# Patient Record
Sex: Male | Born: 1960 | Race: White | Hispanic: No | Marital: Married | Smoking: Never smoker
Health system: Southern US, Community
[De-identification: ages and names within clinical notes are randomized; demographics above are authoritative.]

## PROBLEM LIST (undated history)

## (undated) DIAGNOSIS — R600 Localized edema: Secondary | ICD-10-CM

## (undated) DIAGNOSIS — R5383 Other fatigue: Secondary | ICD-10-CM

## (undated) DIAGNOSIS — B019 Varicella without complication: Secondary | ICD-10-CM

## (undated) DIAGNOSIS — M25569 Pain in unspecified knee: Secondary | ICD-10-CM

## (undated) DIAGNOSIS — N400 Enlarged prostate without lower urinary tract symptoms: Secondary | ICD-10-CM

## (undated) DIAGNOSIS — R12 Heartburn: Secondary | ICD-10-CM

## (undated) HISTORY — DX: Benign prostatic hyperplasia without lower urinary tract symptoms: N40.0

## (undated) HISTORY — DX: Heartburn: R12

## (undated) HISTORY — DX: Other fatigue: R53.83

## (undated) HISTORY — DX: Varicella without complication: B01.9

## (undated) HISTORY — DX: Localized edema: R60.0

## (undated) HISTORY — PX: URETHRA SURGERY: SHX824

## (undated) HISTORY — DX: Pain in unspecified knee: M25.569

---

## 2015-08-21 MED FILL — HYDROCODON-APAP 7.5-325: 7.5-325 | 2 days supply | Qty: 5 | Fill #0

## 2015-08-21 MED FILL — IBUPROFEN 600 MG TABLET: 600 | 5 days supply | Qty: 20 | Fill #0

## 2015-08-21 MED FILL — AMOXICILLIN 875 MG TABLET: 875 | 7 days supply | Qty: 14 | Fill #0

## 2015-08-30 MED FILL — ANDROGEL 1.62% GEL PUMP: 20.25 MG/AC | 60 days supply | Qty: 75 | Fill #1

## 2015-08-30 MED FILL — STRATTERA 60 MG CAPSULE: 60 | 90 days supply | Qty: 90 | Fill #2

## 2015-12-02 MED FILL — STRATTERA 60 MG CAPSULE: 60 | 90 days supply | Qty: 90 | Fill #3

## 2016-03-06 MED FILL — ATOMOXETINE HCL 60 MG CAP: 60 | 90 days supply | Qty: 90 | Fill #4

## 2016-06-05 MED FILL — ATOMOXETINE HCL 60 MG CAP: 60 | 30 days supply | Qty: 30 | Fill #5

## 2016-07-08 MED FILL — ATOMOXETINE HCL 60 MG CAP: 60 | 90 days supply | Qty: 90 | Fill #0

## 2016-09-22 DIAGNOSIS — Z0000 Encounter for general adult medical examination without abnormal findings: Secondary | ICD-10-CM | POA: Diagnosis not present

## 2016-09-22 DIAGNOSIS — R7301 Impaired fasting glucose: Secondary | ICD-10-CM | POA: Diagnosis not present

## 2016-09-22 DIAGNOSIS — E784 Other hyperlipidemia: Secondary | ICD-10-CM | POA: Diagnosis not present

## 2016-09-22 DIAGNOSIS — R41840 Attention and concentration deficit: Secondary | ICD-10-CM | POA: Diagnosis not present

## 2016-09-22 DIAGNOSIS — Z6841 Body Mass Index (BMI) 40.0 and over, adult: Secondary | ICD-10-CM | POA: Diagnosis not present

## 2016-09-22 DIAGNOSIS — Z1211 Encounter for screening for malignant neoplasm of colon: Secondary | ICD-10-CM | POA: Diagnosis not present

## 2016-09-22 DIAGNOSIS — Z125 Encounter for screening for malignant neoplasm of prostate: Secondary | ICD-10-CM | POA: Diagnosis not present

## 2016-09-22 DIAGNOSIS — E291 Testicular hypofunction: Secondary | ICD-10-CM | POA: Diagnosis not present

## 2016-09-22 LAB — HEPATIC FUNCTION PANEL
ALT: 19 (ref 10–40)
AST: 15 (ref 14–40)
Alkaline Phosphatase: 105 (ref 25–125)
Bilirubin, Total: 0.7

## 2016-09-22 LAB — BASIC METABOLIC PANEL
BUN: 22 — AB (ref 4–21)
Creatinine: 1.2 (ref 0.6–1.3)
GLUCOSE: 100
POTASSIUM: 4.6 (ref 3.4–5.3)
Sodium: 139 (ref 137–147)

## 2016-09-22 LAB — CBC AND DIFFERENTIAL
HCT: 45 (ref 41–53)
Hemoglobin: 15.4 (ref 13.5–17.5)
NEUTROS ABS: 6
Platelets: 244 (ref 150–399)
WBC: 8.6

## 2016-09-22 LAB — LIPID PANEL
CHOLESTEROL: 169 (ref 0–200)
HDL: 40 (ref 35–70)
LDL Cholesterol: 90
TRIGLYCERIDES: 197 — AB (ref 40–160)

## 2016-09-22 LAB — PSA: PSA: 0.97

## 2017-09-22 ENCOUNTER — Encounter: Payer: Self-pay | Admitting: Family Medicine

## 2017-09-23 MED FILL — DOXYCYCLINE HYCLATE 100 MG: 100 | 10 days supply | Qty: 20 | Fill #0

## 2017-09-23 MED FILL — CEPHALEXIN 500 MG CAPSULE: 500 | 10 days supply | Qty: 40 | Fill #0

## 2017-10-01 MED FILL — CEPHALEXIN 500 MG CAPSULE: 500 | 10 days supply | Qty: 40 | Fill #0

## 2017-10-01 MED FILL — DOXYCYCLINE HYCLATE 100 MG: 100 | 10 days supply | Qty: 20 | Fill #0

## 2017-10-05 ENCOUNTER — Ambulatory Visit: Payer: Self-pay | Admitting: Adult Health

## 2017-10-05 MED FILL — DICLOFENAC SODIUM 1% GEL: 1 | 10 days supply | Qty: 100 | Fill #0

## 2017-10-21 MED FILL — DICLOFENAC SODIUM 1% GEL: 1 | 10 days supply | Qty: 100 | Fill #1

## 2017-11-01 MED FILL — DICLOFENAC SODIUM 1% GEL: 1 | 10 days supply | Qty: 100 | Fill #2

## 2017-11-16 ENCOUNTER — Other Ambulatory Visit: Payer: Self-pay | Admitting: Family Medicine

## 2017-11-16 ENCOUNTER — Ambulatory Visit (INDEPENDENT_AMBULATORY_CARE_PROVIDER_SITE_OTHER): Payer: No Typology Code available for payment source | Admitting: Adult Health

## 2017-11-16 ENCOUNTER — Encounter: Payer: Self-pay | Admitting: Adult Health

## 2017-11-16 VITALS — BP 140/82 | Temp 97.5°F | Wt 376.0 lb

## 2017-11-16 DIAGNOSIS — Z7689 Persons encountering health services in other specified circumstances: Secondary | ICD-10-CM

## 2017-11-16 DIAGNOSIS — Z1211 Encounter for screening for malignant neoplasm of colon: Secondary | ICD-10-CM

## 2017-11-16 DIAGNOSIS — E668 Other obesity: Secondary | ICD-10-CM

## 2017-11-16 MED ORDER — DICLOFENAC SODIUM 1 % TD GEL: 4.0000 g | Freq: Four times a day (QID) | TRANSDERMAL | 1 refills | Status: DC

## 2017-11-16 MED FILL — DICLOFENAC SODIUM 1% GEL: 1 | 7 days supply | Qty: 100 | Fill #0

## 2017-11-16 NOTE — Telephone Encounter (Signed)
Sent to the pharmacy by e-scribe. 

## 2017-11-16 NOTE — Telephone Encounter (Signed)
Kandee KeenCory, you have not filled medication in the past.  Please advise.

## 2017-11-16 NOTE — Telephone Encounter (Signed)
Ok to refill + 1  

## 2017-11-16 NOTE — Patient Instructions (Signed)
It was great meeting you today   Please follow up with me for your physical   I will refer you to weight loss clinic   Please make sure to sign a release of information for the records from [facility]

## 2017-11-16 NOTE — Progress Notes (Signed)
Patient presents to clinic today to establish care. He is a pleasant 57 year old male who  has a past medical history of Chicken pox and Heartburn.   He is a previous patient of Dr. Modesto Charon at [facility].   His last physical was February 2018.   Acute Concerns: Establish Care   Chronic Issues: GERD - does not take any medication on a regular basis. Acidic/Spicy food is culprit.  Obesity - He would like to be referred to weight loss counseling. His wife has just started and he would like to see Dr. Dalbert Garnet. Reports that he and his wife have started eating better.   BPH with urinary obstruction - Has been using Azo for the last four months. Has had stent in the past? Get up in the middle of the night once to urinate. Has decreased stream. Denies any issues with incomplete bladder emptying.   Health Maintenance: Dental -- Routine Care  Vision --  Does not do routine care  Immunizations --  UTD  Colonoscopy -- Never had does not want one. Will do Cologuard    Past Medical History:  Diagnosis Date  . Chicken pox   . Heartburn     History reviewed. No pertinent surgical history.  Current Outpatient Medications on File Prior to Visit  Medication Sig Dispense Refill  . diclofenac sodium (VOLTAREN) 1 % GEL     . CRANBERRY PO Take by mouth.    . Cyanocobalamin (B-12 PO) Take by mouth.    . Ginkgo Biloba 40 MG TABS Take by mouth.    Marland Kitchen OVER THE COUNTER MEDICATION [facility]     No current facility-administered medications on file prior to visit.     Allergies  Allergen Reactions  . Sulfa Antibiotics     Family History  Problem Relation Age of Onset  . Stroke Father   . Cancer Brother   . Cancer Paternal Grandmother     Social History   Socioeconomic History  . Marital status: Married    Spouse name: Not on file  . Number of children: Not on file  . Years of education: Not on file  . Highest education level: Not on file  Occupational History  .  Not on file  Social Needs  . Financial resource strain: Not on file  . Food insecurity:    Worry: Not on file    Inability: Not on file  . Transportation needs:    Medical: Not on file    Non-medical: Not on file  Tobacco Use  . Smoking status: Never Smoker  . Smokeless tobacco: Never Used  Substance and Sexual Activity  . Alcohol use: Never    Frequency: Never  . Drug use: Not on file  . Sexual activity: Not on file  Lifestyle  . Physical activity:    Days per week: Not on file    Minutes per session: Not on file  . Stress: Not on file  Relationships  . Social connections:    Talks on phone: Not on file    Gets together: Not on file    Attends religious service: Not on file    Active member of club or organization: Not on file    Attends meetings of clubs or organizations: Not on file    Relationship status: Not on file  . Intimate partner violence:    Fear of current or ex partner: Not on file    Emotionally abused: Not on file  Physically abused: Not on file    Forced sexual activity: Not on file  Other Topics Concern  . Not on file  Social History Narrative  . Not on file    Review of Systems  Constitutional: Negative.   HENT: Negative.   Respiratory: Negative.   Cardiovascular: Negative.   Genitourinary: Negative.   Musculoskeletal: Positive for joint pain (left knee ).  Neurological: Negative.   Endo/Heme/Allergies: Negative.   Psychiatric/Behavioral: Negative.   All other systems reviewed and are negative.      BP 140/82   Temp (!) 97.5 F (36.4 C) (Oral)   Wt (!) 376 lb (170.6 kg)   Physical Exam  Constitutional: He is oriented to person, place, and time. He appears well-developed and well-nourished. No distress.  Obese    Cardiovascular: Normal rate, regular rhythm, normal heart sounds and intact distal pulses. Exam reveals no gallop and no friction rub.  No murmur heard. Pulmonary/Chest: Effort normal and breath sounds normal. No stridor.  No respiratory distress. He has no wheezes. He has no rales. He exhibits no tenderness.  Musculoskeletal: He exhibits edema (bilateral non pitting edema ). He exhibits no tenderness or deformity.  Neurological: He is alert and oriented to person, place, and time. He displays normal reflexes. No cranial nerve deficit or sensory deficit. He exhibits normal muscle tone. Coordination normal.  Skin: Skin is dry. Capillary refill takes less than 2 seconds. No rash noted. He is not diaphoretic. No erythema. No pallor.  Psychiatric: He has a normal mood and affect. His behavior is normal. Judgment and thought content normal.  Nursing note and vitals reviewed.  Assessment/Plan: 1. Encounter to establish care - Follow up for CPE  - Encouraged to start eating healthy and exercising    2. Other obesity - Amb Ref to Medical Weight Management  3. Colon cancer screening - Will order cologuard   Shirline Freesory Nyjae Hodge, NP

## 2017-12-06 MED FILL — DICLOFENAC SODIUM 1% GEL: 1 | 10 days supply | Qty: 100 | Fill #3

## 2017-12-21 ENCOUNTER — Encounter: Payer: Self-pay | Admitting: Adult Health

## 2017-12-21 ENCOUNTER — Ambulatory Visit (INDEPENDENT_AMBULATORY_CARE_PROVIDER_SITE_OTHER): Payer: No Typology Code available for payment source | Admitting: Adult Health

## 2017-12-21 ENCOUNTER — Encounter: Payer: Self-pay | Admitting: Family Medicine

## 2017-12-21 VITALS — BP 120/86 | Temp 97.8°F | Ht 73.0 in | Wt 370.0 lb

## 2017-12-21 DIAGNOSIS — M25561 Pain in right knee: Secondary | ICD-10-CM

## 2017-12-21 DIAGNOSIS — Z0000 Encounter for general adult medical examination without abnormal findings: Secondary | ICD-10-CM | POA: Diagnosis not present

## 2017-12-21 DIAGNOSIS — E668 Other obesity: Secondary | ICD-10-CM

## 2017-12-21 DIAGNOSIS — R351 Nocturia: Secondary | ICD-10-CM

## 2017-12-21 DIAGNOSIS — M25562 Pain in left knee: Secondary | ICD-10-CM | POA: Diagnosis not present

## 2017-12-21 DIAGNOSIS — R3912 Poor urinary stream: Secondary | ICD-10-CM

## 2017-12-21 DIAGNOSIS — Z1159 Encounter for screening for other viral diseases: Secondary | ICD-10-CM

## 2017-12-21 DIAGNOSIS — K219 Gastro-esophageal reflux disease without esophagitis: Secondary | ICD-10-CM | POA: Diagnosis not present

## 2017-12-21 DIAGNOSIS — G8929 Other chronic pain: Secondary | ICD-10-CM

## 2017-12-21 DIAGNOSIS — Z114 Encounter for screening for human immunodeficiency virus [HIV]: Secondary | ICD-10-CM

## 2017-12-21 DIAGNOSIS — N401 Enlarged prostate with lower urinary tract symptoms: Secondary | ICD-10-CM | POA: Diagnosis not present

## 2017-12-21 LAB — LIPID PANEL
CHOLESTEROL: 163 mg/dL (ref 0–200)
HDL: 37.5 mg/dL — ABNORMAL LOW (ref >39.00)
LDL Cholesterol: 93 mg/dL (ref 0–99)
NonHDL: 125.13
Total CHOL/HDL Ratio: 4
Triglycerides: 161 mg/dL — ABNORMAL HIGH (ref 0.0–149.0)
VLDL: 32.2 mg/dL (ref 0.0–40.0)

## 2017-12-21 LAB — CBC WITH DIFFERENTIAL/PLATELET
BASOS PCT: 0.5 % (ref 0.0–3.0)
Basophils Absolute: 0 10*3/uL (ref 0.0–0.1)
EOS PCT: 2.4 % (ref 0.0–5.0)
Eosinophils Absolute: 0.2 10*3/uL (ref 0.0–0.7)
HCT: 43.4 % (ref 39.0–52.0)
HEMOGLOBIN: 15.2 g/dL (ref 13.0–17.0)
LYMPHS PCT: 19.4 % (ref 12.0–46.0)
Lymphs Abs: 1.4 10*3/uL (ref 0.7–4.0)
MCHC: 35.1 g/dL (ref 30.0–36.0)
MCV: 84 fl (ref 78.0–100.0)
MONO ABS: 0.5 10*3/uL (ref 0.1–1.0)
MONOS PCT: 6.4 % (ref 3.0–12.0)
Neutro Abs: 5.1 10*3/uL (ref 1.4–7.7)
Neutrophils Relative %: 71.3 % (ref 43.0–77.0)
Platelets: 276 10*3/uL (ref 150.0–400.0)
RBC: 5.16 Mil/uL (ref 4.22–5.81)
RDW: 14.5 % (ref 11.5–15.5)
WBC: 7.1 10*3/uL (ref 4.0–10.5)

## 2017-12-21 LAB — HEPATIC FUNCTION PANEL
ALK PHOS: 102 U/L (ref 39–117)
ALT: 17 U/L (ref 0–53)
AST: 11 U/L (ref 0–37)
Albumin: 4 g/dL (ref 3.5–5.2)
BILIRUBIN DIRECT: 0.1 mg/dL (ref 0.0–0.3)
BILIRUBIN TOTAL: 0.7 mg/dL (ref 0.2–1.2)
Total Protein: 7.4 g/dL (ref 6.0–8.3)

## 2017-12-21 LAB — TSH: TSH: 2.65 u[IU]/mL (ref 0.35–4.50)

## 2017-12-21 LAB — BASIC METABOLIC PANEL
BUN: 17 mg/dL (ref 6–23)
CALCIUM: 8.9 mg/dL (ref 8.4–10.5)
CO2: 26 mEq/L (ref 19–32)
Chloride: 104 mEq/L (ref 96–112)
Creatinine, Ser: 1.01 mg/dL (ref 0.40–1.50)
GFR: 80.74 mL/min (ref >60.00)
GLUCOSE: 111 mg/dL — AB (ref 70–99)
Potassium: 4.4 mEq/L (ref 3.5–5.1)
SODIUM: 140 meq/L (ref 135–145)

## 2017-12-21 LAB — HEMOGLOBIN A1C: Hgb A1c MFr Bld: 5 % (ref 4.6–6.5)

## 2017-12-21 LAB — PSA: PSA: 0.9 ng/mL (ref 0.10–4.00)

## 2017-12-21 MED ORDER — DICLOFENAC SODIUM 1 % TD GEL: 4.0000 g | Freq: Four times a day (QID) | TRANSDERMAL | 1 refills | Status: DC

## 2017-12-21 MED FILL — DICLOFENAC SODIUM 1% GEL: 1 | 6 days supply | Qty: 100 | Fill #0

## 2017-12-21 NOTE — Progress Notes (Signed)
Subjective:    Patient ID: Justin Bowen, male    DOB: Jun 23, 1961, 57 y.o.   MRN: [id number]  HPI  Patient presents for yearly preventative medicine examination. He is a pleasant 57 year old male who  has a past medical history of BPH (benign prostatic hyperplasia), Chicken pox, and Heartburn.   Chronic knee pain - uses Voltaren gel - reports that this works well for him.   BPH - chronic issue. Reports " it is not that bad". He does not feel as though he needs any medication for this   All immunizations and health maintenance protocols were reviewed with the patient and needed orders were placed.  Appropriate screening laboratory values were ordered for the patient including screening of hyperlipidemia, renal function and hepatic function. If indicated by BPH, a PSA was ordered.  Medication reconciliation,  past medical history, social history, problem list and allergies were reviewed in detail with the patient  Goals were established with regard to weight loss, exercise, and  diet in compliance with medications. He has started making life style modification at home through diet. He is waiting on an appointment with the weight loss clinic.  Wt Readings from Last 3 Encounters:  12/21/17 (!) 370 lb (167.8 kg)  11/16/17 (!) 376 lb (170.6 kg)    End of life planning was discussed.  He has opted to due cologuard. He has it at home but has not returned it. He does routine dental screens but has not made an appointment with eye doctor yet    Review of Systems  Constitutional: Negative.   HENT: Negative.   Eyes: Negative.   Respiratory: Negative.   Cardiovascular: Negative.   Gastrointestinal: Negative.   Endocrine: Negative.   Genitourinary: Negative.   Musculoskeletal: Positive for arthralgias.  Skin: Negative.   Allergic/Immunologic: Negative.   Neurological: Negative.   Hematological: Negative.   Psychiatric/Behavioral: Negative.   All other systems reviewed and are  negative.  Past Medical History:  Diagnosis Date  . BPH (benign prostatic hyperplasia)   . Chicken pox   . Heartburn     Social History   Socioeconomic History  . Marital status: Married    Spouse name: Not on file  . Number of children: Not on file  . Years of education: Not on file  . Highest education level: Not on file  Occupational History  . Not on file  Social Needs  . Financial resource strain: Not on file  . Food insecurity:    Worry: Not on file    Inability: Not on file  . Transportation needs:    Medical: Not on file    Non-medical: Not on file  Tobacco Use  . Smoking status: Never Smoker  . Smokeless tobacco: Never Used  Substance and Sexual Activity  . Alcohol use: Never    Frequency: Never  . Drug use: Never  . Sexual activity: Not on file  Lifestyle  . Physical activity:    Days per week: Not on file    Minutes per session: Not on file  . Stress: Not on file  Relationships  . Social connections:    Talks on phone: Not on file    Gets together: Not on file    Attends religious service: Not on file    Active member of club or organization: Not on file    Attends meetings of clubs or organizations: Not on file    Relationship status: Not on file  . Intimate partner violence:  Fear of current or ex partner: Not on file    Emotionally abused: Not on file    Physically abused: Not on file    Forced sexual activity: Not on file  Other Topics Concern  . 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 No pertinent surgical history.  Family History  Problem Relation Age of Onset  . Stroke Father 66  . Cancer Brother        brain cancer   . Cancer Paternal Grandmother 45       pancreatic cancer     Allergies  Allergen Reactions  . Sulfa Antibiotics     Current Outpatient Medications on File Prior to Visit  Medication Sig Dispense Refill  . CRANBERRY PO Take by mouth.     . Cyanocobalamin (B-12 PO) Take by mouth.     No current facility-administered medications on file prior to visit.     BP 120/86   Temp 97.8 F (36.6 C) (Oral)   Ht  (1.854 m)   Wt (!) 370 lb (167.8 kg)   BMI 48.82 kg/m       Objective:   Physical Exam  Constitutional: He is oriented to person, place, and time. He appears well-developed and well-nourished. No distress.  Obese   HENT:  Head: Normocephalic and atraumatic.  Right Ear: External ear normal.  Left Ear: External ear normal.  Nose: Nose normal.  Mouth/Throat: Oropharynx is clear and moist. No oropharyngeal exudate.  Eyes: Pupils are equal, round, and reactive to light. Conjunctivae and EOM are normal. Right eye exhibits no discharge. Left eye exhibits no discharge. No scleral icterus.  Neck: Normal range of motion. Neck supple. No JVD present. No tracheal deviation present. No thyromegaly present.  Cardiovascular: Normal rate, regular rhythm, normal heart sounds and intact distal pulses. Exam reveals no gallop and no friction rub.  No murmur heard. Pulmonary/Chest: Effort normal and breath sounds normal. No stridor. No respiratory distress. He has no wheezes. He has no rales. He exhibits no tenderness.  Abdominal: Soft. Bowel sounds are normal. He exhibits no distension and no mass. There is no tenderness. There is no rebound and no guarding. No hernia.  Musculoskeletal: Normal range of motion. He exhibits no edema, tenderness or deformity.  Lymphadenopathy:    He has no cervical adenopathy.  Neurological: He is alert and oriented to person, place, and time. He displays normal reflexes. No cranial nerve deficit or sensory deficit. He exhibits normal muscle tone. Coordination normal.  Skin: Skin is warm and dry. Capillary refill takes less than 2 seconds. No rash noted. He is not diaphoretic. No erythema. No pallor.  Psychiatric: He has a normal mood and affect. His behavior is normal. Judgment and thought  content normal.  Nursing note and vitals reviewed.     Assessment & Plan:  1. Benign prostatic hyperplasia with nocturia  - PSA  2. Gastroesophageal reflux disease, esophagitis presence not specified - No issues currently.   3. Routine general medical examination at a health care facility  - diclofenac sodium (VOLTAREN) 1 % GEL; Apply 4 g topically 4 (four) times daily.  Dispense: 100 g; Refill: 1 - Basic metabolic panel - CBC with Differential/Platelet - Hemoglobin A1c - Hepatic function panel - Lipid panel - TSH - PSA - HIV antibody  4. Need for hepatitis C screening test  - Hep C Antibody  5.  Encounter for screening for HIV  - HIV antibody  6. Chronic pain of both knees  - diclofenac sodium (VOLTAREN) 1 % GEL; Apply 4 g topically 4 (four) times daily.  Dispense: 100 g; Refill: 1  7. Other obesity - Continue with life style modifications until seen by Weight loss clinic  - Basic metabolic panel - CBC with Differential/Platelet - Hemoglobin A1c - Hepatic function panel - Lipid panel - TSH  Shirline Frees, NP

## 2017-12-22 LAB — HEPATITIS C ANTIBODY
Hepatitis C Ab: NONREACTIVE
SIGNAL TO CUT-OFF: 0.01 (ref <1.00)

## 2017-12-22 LAB — HIV ANTIBODY (ROUTINE TESTING W REFLEX): HIV: NONREACTIVE

## 2017-12-23 MED FILL — IBUPROFEN 600 MG TABLET: 600 | 5 days supply | Qty: 20 | Fill #0

## 2017-12-23 MED FILL — HYDROCODON-APAP 7.5-325: 7.5-325 | 1 days supply | Qty: 2 | Fill #0

## 2017-12-23 MED FILL — AMOXICILLIN 875 MG TABS: 875 | 7 days supply | Qty: 14 | Fill #0

## 2018-01-21 MED FILL — DICLOFENAC SODIUM 1% GEL: 1 | 6 days supply | Qty: 100 | Fill #1

## 2018-02-21 ENCOUNTER — Telehealth: Payer: Self-pay | Admitting: Family Medicine

## 2018-02-21 NOTE — Telephone Encounter (Signed)
Copied from CRM [phone]. Topic: General - Other >> Feb 21, 2018 11:17 AM Marylen PontoMcneil, Ja-Kwan wrote: Reason for CRM: Pt state they have moved and he would need to transfer care to [facility]remer at [facility]. Cb# [phone]

## 2018-02-22 NOTE — Telephone Encounter (Signed)
Ok

## 2018-02-22 NOTE — Telephone Encounter (Signed)
Okay to schedule patient. 

## 2018-02-22 NOTE — Telephone Encounter (Signed)
Okay 

## 2018-02-25 ENCOUNTER — Ambulatory Visit (INDEPENDENT_AMBULATORY_CARE_PROVIDER_SITE_OTHER): Payer: No Typology Code available for payment source | Admitting: Family Medicine

## 2018-02-25 ENCOUNTER — Encounter: Payer: Self-pay | Admitting: Family Medicine

## 2018-02-25 ENCOUNTER — Ambulatory Visit (INDEPENDENT_AMBULATORY_CARE_PROVIDER_SITE_OTHER): Payer: No Typology Code available for payment source

## 2018-02-25 VITALS — BP 138/80 | HR 84 | Ht 73.0 in | Wt 378.0 lb

## 2018-02-25 DIAGNOSIS — N401 Enlarged prostate with lower urinary tract symptoms: Secondary | ICD-10-CM

## 2018-02-25 DIAGNOSIS — Z6841 Body Mass Index (BMI) 40.0 and over, adult: Secondary | ICD-10-CM | POA: Insufficient documentation

## 2018-02-25 DIAGNOSIS — R300 Dysuria: Secondary | ICD-10-CM

## 2018-02-25 DIAGNOSIS — G8929 Other chronic pain: Secondary | ICD-10-CM | POA: Diagnosis not present

## 2018-02-25 DIAGNOSIS — M25562 Pain in left knee: Secondary | ICD-10-CM

## 2018-02-25 DIAGNOSIS — Z1211 Encounter for screening for malignant neoplasm of colon: Secondary | ICD-10-CM | POA: Insufficient documentation

## 2018-02-25 DIAGNOSIS — R3912 Poor urinary stream: Secondary | ICD-10-CM

## 2018-02-25 DIAGNOSIS — M25561 Pain in right knee: Secondary | ICD-10-CM

## 2018-02-25 DIAGNOSIS — Z0001 Encounter for general adult medical examination with abnormal findings: Secondary | ICD-10-CM | POA: Insufficient documentation

## 2018-02-25 DIAGNOSIS — Z0000 Encounter for general adult medical examination without abnormal findings: Secondary | ICD-10-CM

## 2018-02-25 MED ORDER — DICLOFENAC SODIUM 1 % TD GEL: 2.0000 g | Freq: Four times a day (QID) | TRANSDERMAL | 1 refills | Status: DC

## 2018-02-25 MED ORDER — DOXAZOSIN MESYLATE 2 MG PO TABS: 2.0000 mg | ORAL_TABLET | Freq: Every day | ORAL | 1 refills | Status: DC

## 2018-02-25 MED FILL — DOXAZOSIN MESYLATE 2 MG TAB: 2 | 30 days supply | Qty: 30 | Fill #0

## 2018-02-25 MED FILL — DICLOFENAC SODIUM 1% GEL: 1 | 13 days supply | Qty: 100 | Fill #0

## 2018-02-25 NOTE — Patient Instructions (Addendum)
Knee Pain, Adult Knee pain in adults is common. It can be caused by many things, including:  Arthritis.  A fluid-filled sac (cyst) or growth in your knee.  An infection in your knee.  An injury that will not heal.  Damage, swelling, or irritation of the tissues that support your knee.  Knee pain is usually not a sign of a serious problem. The pain may go away on its own with time and rest. If it does not, a health care provider may order tests to find the cause of the pain. These may include:  Imaging tests, such as an X-ray, MRI, or ultrasound.  Joint aspiration. In this test, fluid is removed from the knee.  Arthroscopy. In this test, a lighted tube is inserted into knee and an image is projected onto a TV screen.  A biopsy. In this test, a sample of tissue is removed from the body and studied under a microscope.  Follow these instructions at home: Pay attention to any changes in your symptoms. Take these actions to relieve your pain. Activity  Rest your knee.  Do not do things that cause pain or make pain worse.  Avoid high-impact activities or exercises, such as running, jumping rope, or doing jumping jacks. General instructions  Take over-the-counter and prescription medicines only as told by your health care provider.  Raise (elevate) your knee above the level of your heart when you are sitting or lying down.  Sleep with a pillow under your knee.  If directed, apply ice to the knee: ? Put ice in a plastic bag. ? Place a towel between your skin and the bag. ? Leave the ice on for 20 minutes, 2-3 times a day.  Ask your health care provider if you should wear an elastic knee support.  Lose weight if you are overweight. Extra weight can put pressure on your knee.  Do not use any products that contain nicotine or tobacco, such as cigarettes and e-cigarettes. Smoking may slow the healing of any bone and joint problems that you may have. If you need help quitting, ask  your health care provider. Contact a health care provider if:  Your knee pain continues, changes, or gets worse.  You have a fever along with knee pain.  Your knee buckles or locks up.  Your knee swells, and the swelling becomes worse. Get help right away if:  Your knee feels warm to the touch.  You cannot move your knee.  You have severe pain in your knee.  You have chest pain.  You have trouble breathing. Summary  Knee pain in adults is common. It can be caused by many things, including, arthritis, infection, cysts, or injury.  Knee pain is usually not a sign of a serious problem, but if it does not go away, a health care provider may perform tests to know the cause of the pain.  Pay attention to any changes in your symptoms. Relieve your pain with rest, medicines, light activity, and use of ice.  Get help if your pain continues or becomes very severe, or if your knee buckles or locks up, or if you have chest pain or trouble breathing. This information is not intended to replace advice given to you by your health care provider. Make sure you discuss any questions you have with your health care provider. Document Released: 05/17/2007 Document Revised: 07/10/2016 Document Reviewed: 07/10/2016 Elsevier Interactive Patient Education  2018 [employer]  Benign Prostatic Hyperplasia Benign prostatic hyperplasia (BPH) is  an enlarged prostate gland that is caused by the normal aging process and not by cancer. The prostate is a walnut-sized gland that is involved in the production of semen. It is located in front of the rectum and below the bladder. The bladder stores urine and the urethra is the tube that carries the urine out of the body. The prostate may get bigger as a man gets older. An enlarged prostate can press on the urethra. This can make it harder to pass urine. The build-up of urine in the bladder can cause infection. Back pressure and infection may progress to bladder  damage and kidney (renal) failure. What are the causes? This condition is part of a normal aging process. However, not all men develop problems from this condition. If the prostate enlarges away from the urethra, urine flow will not be blocked. If it enlarges toward the urethra and compresses it, there will be problems passing urine. What increases the risk? This condition is more likely to develop in men over the age of 50 years. What are the signs or symptoms? Symptoms of this condition include:  Getting up often during the night to urinate.  Needing to urinate frequently during the day.  Difficulty starting urine flow.  Decrease in size and strength of your urine stream.  Leaking (dribbling) after urinating.  Inability to pass urine. This needs immediate treatment.  Inability to completely empty your bladder.  Pain when you pass urine. This is more common if there is also an infection.  Urinary tract infection (UTI).  How is this diagnosed? This condition is diagnosed based on your medical history, a physical exam, and your symptoms. Tests will also be done, such as:  A post-void bladder scan. This measures any amount of urine that may remain in your bladder after you finish urinating.  A digital rectal exam. In a rectal exam, your health care provider checks your prostate by putting a lubricated, gloved finger into your rectum to feel the back of your prostate gland. This exam detects the size of your gland and any abnormal lumps or growths.  An exam of your urine (urinalysis).  A prostate specific antigen (PSA) screening. This is a blood test used to screen for prostate cancer.  An ultrasound. This test uses sound waves to electronically produce a picture of your prostate gland.  Your health care provider may refer you to a specialist in kidney and prostate diseases (urologist). How is this treated? Once symptoms begin, your health care provider will monitor your  condition (active surveillance or watchful waiting). Treatment for this condition will depend on the severity of your condition. Treatment may include:  Observation and yearly exams. This may be the only treatment needed if your condition and symptoms are mild.  Medicines to relieve your symptoms, including: ? Medicines to shrink the prostate. ? Medicines to relax the muscle of the prostate.  Surgery in severe cases. Surgery may include: ? Prostatectomy. In this procedure, the prostate tissue is removed completely through an open incision or with a laparascope or robotics. ? Transurethral resection of the prostate (TURP). In this procedure, a tool is inserted through the opening at the tip of the penis (urethra). It is used to cut away tissue of the inner core of the prostate. The pieces are removed through the same opening of the penis. This removes the blockage. ? Transurethral incision (TUIP). In this procedure, small cuts are made in the prostate. This lessens the prostate's pressure on the urethra. ?  Transurethral microwave thermotherapy (TUMT). This procedure uses microwaves to create heat. The heat destroys and removes a small amount of prostate tissue. ? Transurethral needle ablation (TUNA). This procedure uses radio frequencies to destroy and remove a small amount of prostate tissue. ? Interstitial laser coagulation (ILC). This procedure uses a laser to destroy and remove a small amount of prostate tissue. ? Transurethral electrovaporization (TUVP). This procedure uses electrodes to destroy and remove a small amount of prostate tissue. ? Prostatic urethral lift. This procedure inserts an implant to push the lobes of the prostate away from the urethra.  Follow these instructions at home:  Take over-the-counter and prescription medicines only as told by your health care provider.  Monitor your symptoms for any changes. Contact your health care provider with any changes.  Avoid drinking  large amounts of liquid before going to bed or out in public.  Avoid or reduce how much caffeine or alcohol you drink.  Give yourself time when you urinate.  Keep all follow-up visits as told by your health care provider. This is important. Contact a health care provider if:  You have unexplained back pain.  Your symptoms do not get better with treatment.  You develop side effects from the medicine you are taking.  Your urine becomes very dark or has a bad smell.  Your lower abdomen becomes distended and you have trouble passing your urine. Get help right away if:  You have a fever or chills.  You suddenly cannot urinate.  You feel lightheaded, or very dizzy, or you faint.  There are large amounts of blood or clots in the urine.  Your urinary problems become hard to manage.  You develop moderate to severe low back or flank pain. The flank is the side of your body between the ribs and the hip. These symptoms may represent a serious problem that is an emergency. Do not wait to see if the symptoms will go away. Get medical help right away. Call your local emergency services (911 in the [place] Do not drive yourself to the hospital. Summary  Benign prostatic hyperplasia (BPH) is an enlarged prostate that is caused by the normal aging process and not by cancer.  An enlarged prostate can press on the urethra. This can make it hard to pass urine.  This condition is part of a normal aging process and is more likely to develop in men over the age of 50 years.  Get help right away if you suddenly cannot urinate. This information is not intended to replace advice given to you by your health care provider. Make sure you discuss any questions you have with your health care provider. Document Released: 07/20/2005 Document Revised: 08/24/2016 Document Reviewed: 08/24/2016 Elsevier Interactive Patient Education  2018 [employer]. Tamsulosin capsules What is this medicine? TAMSULOSIN  (tam SOO loe sin) is used to treat enlargement of the prostate gland in men, a condition called benign prostatic hyperplasia or BPH. It is not for use in women. It works by relaxing muscles in the prostate and bladder neck. This improves urine flow and reduces BPH symptoms. This medicine may be used for other purposes; ask your health care provider or pharmacist if you have questions. COMMON BRAND NAME(S): Flomax What should I tell my health care provider before I take this medicine? They need to know if you have any of the following conditions: -advanced kidney disease -advanced liver disease -low blood pressure -prostate cancer -an unusual or allergic reaction to tamsulosin, sulfa drugs, other  medicines, foods, dyes, or preservatives -pregnant or trying to get pregnant -breast-feeding How should I use this medicine? Take this medicine by mouth about 30 minutes after the same meal every day. Follow the directions on the prescription label. Swallow the capsules whole with a glass of water. Do not crush, chew, or open capsules. Do not take your medicine more often than directed. Do not stop taking your medicine unless your doctor tells you to. Talk to your pediatrician regarding the use of this medicine in children. Special care may be needed. Overdosage: If you think you have taken too much of this medicine contact a poison control center or emergency room at once. NOTE: This medicine is only for you. Do not share this medicine with others. What if I miss a dose? If you miss a dose, take it as soon as you can. If it is almost time for your next dose, take only that dose. Do not take double or extra doses. If you stop taking your medicine for several days or more, ask your doctor or health care professional what dose you should start back on. What may interact with this medicine? -cimetidine -fluoxetine -ketoconazole -medicines for erectile disfunction like sildenafil, tadalafil,  vardenafil -medicines for high blood pressure -other alpha-blockers like alfuzosin, doxazosin, phentolamine, phenoxybenzamine, prazosin, terazosin -warfarin This list may not describe all possible interactions. Give your health care provider a list of all the medicines, herbs, non-prescription drugs, or dietary supplements you use. Also tell them if you smoke, drink alcohol, or use illegal drugs. Some items may interact with your medicine. What should I watch for while using this medicine? Visit your doctor or health care professional for regular check ups. You will need lab work done before you start this medicine and regularly while you are taking it. Check your blood pressure as directed. Ask your health care professional what your blood pressure should be, and when you should contact him or her. This medicine may make you feel dizzy or lightheaded. This is more likely to happen after the first dose, after an increase in dose, or during hot weather or exercise. Drinking alcohol and taking some medicines can make this worse. Do not drive, use machinery, or do anything that needs mental alertness until you know how this medicine affects you. Do not sit or stand up quickly. If you begin to feel dizzy, sit down until you feel better. These effects can decrease once your body adjusts to the medicine. Contact your doctor or health care professional right away if you have an erection that lasts longer than 4 hours or if it becomes painful. This may be a sign of a serious problem and must be treated right away to prevent permanent damage. If you are thinking of having cataract surgery, tell your eye surgeon that you have taken this medicine. What side effects may I notice from receiving this medicine? Side effects that you should report to your doctor or health care professional as soon as possible: -allergic reactions like skin rash or itching, hives, swelling of the lips, mouth, tongue, or  throat -breathing problems -change in vision -feeling faint or lightheaded -irregular heartbeat -prolonged or painful erection -weakness Side effects that usually do not require medical attention (report to your doctor or health care professional if they continue or are bothersome): -back pain -change in sex drive or performance -constipation, nausea or vomiting -cough -drowsy -runny or stuffy nose -trouble sleeping This list may not describe all possible side effects. Call your  doctor for medical advice about side effects. You may report side effects to FDA at 1-800-FDA-1088. Where should I keep my medicine? Keep out of the reach of children. Store at room temperature between 15 and 30 degrees C (59 and 86 degrees F). Throw away any unused medicine after the expiration date. NOTE: This sheet is a summary. It may not cover all possible information. If you have questions about this medicine, talk to your doctor, pharmacist, or health care provider.  2018 Elsevier/Gold Standard (2012-07-20 14:11:34) Doxazosin tablets What is this medicine? DOXAZOSIN (dox AY zoe sin) is an antihypertensive. It works by relaxing the blood vessels. It is used to treat benign prostatic hyperplasia (BPH) in men and to treat high blood pressure in both men and women. This medicine may be used for other purposes; ask your health care provider or pharmacist if you have questions. COMMON BRAND NAME(S): Cardura What should I tell my health care provider before I take this medicine? They need to know if you have any of the following conditions: -kidney or liver disease -low blood pressure -an unusual or allergic reaction to doxazosin, other medicines, foods, dyes, or preservatives -pregnant or trying to get pregnant -breast-feeding How should I use this medicine? Take this medicine by mouth with a glass of water. Follow the directions on the prescription label. Take your doses at regular intervals. Do not take  your medicine more often than directed. Do not stop taking except on the advice of your doctor or health care professional. Talk to your pediatrician regarding the use of this medicine in children. Special care may be needed. Overdosage: If you think you have taken too much of this medicine contact a poison control center or emergency room at once. NOTE: This medicine is only for you. Do not share this medicine with others. What if I miss a dose? If you miss a dose, take it as soon as you can. If it is almost time for your next dose, take only that dose. Do not take double or extra doses. What may interact with this medicine? -cimetidine -medicines for colds or hay fever -medicines for overactive bladder -sildenafil -tadalafil -vardenafil This list may not describe all possible interactions. Give your health care provider a list of all the medicines, herbs, non-prescription drugs, or dietary supplements you use. Also tell them if you smoke, drink alcohol, or use illegal drugs. Some items may interact with your medicine. What should I watch for while using this medicine? Visit your doctor or health care professional for regular checks on your progress. Check your blood pressure regularly. Ask your doctor or health care professional what your blood pressure should be and when you should contact him or her. Drowsiness and dizziness are more likely to occur after the first dose, after an increase in dose, or during hot weather or exercise. These effects can decrease once your body adjusts to this medicine. Do not drive, use machinery, or do anything that needs mental alertness until you know how this drug affects you. Do not stand or sit up quickly, especially if you are an older patient. This reduces the risk of dizzy or fainting spells. Alcohol can make you more drowsy and dizzy. Avoid alcoholic drinks. Do not treat yourself for coughs, colds, or pain while you are taking this medicine without asking  your doctor or health care professional for advice. Some ingredients may increase your blood pressure. Your mouth may get dry. Chewing sugarless gum or sucking hard candy, and drinking  plenty of water may help. Contact your doctor if the problem does not go away or is severe. If you are going to have eye surgery for cataracts, tell your doctor or health care professional you are taking this medicine. A condition called Intraoperative Floppy Iris Syndrome (IFIS) can happen if you have taken this medicine. For males, contact your doctor or health care professional right away if you have an erection that lasts longer than 4 hours or if it becomes painful. This may be a sign of a serious problem and must be treated right away to prevent permanent damage. What side effects may I notice from receiving this medicine? Side effects that you should report to your doctor or health care professional as soon as possible: -allergic reactions like skin rash, itching or hives, swelling of the face, lips, or tongue -breathing problems -changes in vision -chest pain -fast or irregular heartbeat -feeling faint or lightheaded, falls -males: prolonged or painful erection -numbness in hands or feet -swelling of the legs or ankles -unusually weak or tired Side effects that usually do not require medical attention (report to your doctor or health care professional if they continue or are bothersome): -headache -nausea This list may not describe all possible side effects. Call your doctor for medical advice about side effects. You may report side effects to FDA at 1-800-FDA-1088. Where should I keep my medicine? Keep out of the reach of children. Store at room temperature below 30 degrees C (86 degrees F). Throw away any unused medicine after the expiration date. NOTE: This sheet is a summary. It may not cover all possible information. If you have questions about this medicine, talk to your doctor, pharmacist, or  health care provider.  2018 Elsevier/Gold Standard (2012-07-20 14:02:30)

## 2018-02-25 NOTE — Progress Notes (Signed)
Subjective:  Patient ID: Justin Bowen, male    DOB: 01-09-1961  Age: 57 y.o. MRN: [id number]  CC: Establish Care   HPI [name] presents for establishment of care and follow-up of some of his medical issues.  He has a long-standing history of chronic bilateral knee pain.  Pain is responded to Voltaren gel and he requests a refill.  He denies any recent injuries to his knees.  Other than the pain they are not giving him problems.  Long-standing history of morbid obesity obesity.  He has an upcoming clinic visit with the weight loss center.  He tells of an ongoing history of urinary hesitancy, weak urinary stream, sensation of incomplete emptying with nocturia x1.  He tells me that he had some type of urological procedure 20 years ago.  It had helped his urine flow at that time.  Recent PSA drawn back in April was 0.9.  He has occasional dysuria without discharge.  He has been taking AZO standard as needed.  He has swelling in his lower extremities that happens to be slightly worse on the left.  Swelling decreases at night but then reaccumulate says he is up on his feet during the day.  He has no issues with his heart liver or kidneys.  Recent blood work from the chart was reviewed and found to be essentially normal.  He has no history of hypertension.  Patient lives with his wife who is an oncology nurse at [employer].  They have 2 grown daughters and 2  grandchildren.  He is currently supervising the renovation of their home.  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  He took a leave of absence from the [employer] to work on his home.  He is not interested in having a colonoscopy but did agree to accept information on Cologuard.  He did ask about being screened for HIV.  Explained that the current recommendations are to screen every 1.  Outpatient Medications Prior to Visit  Medication Sig Dispense Refill  . CRANBERRY PO Take by mouth.    . Cyanocobalamin (B-12 PO) Take by mouth.     . diclofenac sodium (VOLTAREN) 1 % GEL Apply 4 g topically 4 (four) times daily. 100 g 1   No facility-administered medications prior to visit.     ROS Review of Systems  Constitutional: Negative for chills, fatigue, fever and unexpected weight change.  Respiratory: Negative for chest tightness, shortness of breath and wheezing.   Cardiovascular: Negative for chest pain and palpitations.  Gastrointestinal: Negative.   Endocrine: Negative for cold intolerance and heat intolerance.  Genitourinary: Positive for difficulty urinating and dysuria. Negative for decreased urine volume and discharge.  Musculoskeletal: Positive for arthralgias.  Skin: Negative for pallor and rash.  Allergic/Immunologic: Negative for immunocompromised state.  Neurological: Negative for weakness and headaches.  Hematological: Does not bruise/bleed easily.  Psychiatric/Behavioral: Negative.     Objective:  BP 138/80   Pulse 84   Ht 6' 1"  (1.854 m)   Wt (!) 378 lb (171.5 kg)   SpO2 97%   BMI 49.87 kg/m   BP Readings from Last 3 Encounters:  02/25/18 138/80  12/21/17 120/86  11/16/17 140/82    Wt Readings from Last 3 Encounters:  02/25/18 (!) 378 lb (171.5 kg)  12/21/17 (!) 370 lb (167.8 kg)  11/16/17 (!) 376 lb (170.6 kg)    Physical Exam  Constitutional: He is oriented to person, place, and time. He appears well-developed  and well-nourished. No distress.  HENT:  Head: Normocephalic and atraumatic.  Right Ear: External ear normal.  Left Ear: External ear normal.  Mouth/Throat: No oropharyngeal exudate.  Eyes: Right eye exhibits no discharge. Left eye exhibits no discharge. No scleral icterus.  Neck: No JVD present. No tracheal deviation present.  Pulmonary/Chest: Effort normal.  Genitourinary: Rectal exam shows no external hemorrhoid, no internal hemorrhoid, no fissure, no mass, no tenderness, anal tone normal and guaiac negative stool. Prostate is not enlarged (prostate not reached with  exam).  Neurological: He is alert and oriented to person, place, and time.  Skin: He is not diaphoretic.  Psychiatric: He has a normal mood and affect. His behavior is normal.    Lab Results  Component Value Date   WBC 7.1 12/21/2017   HGB 15.2 12/21/2017   HCT 43.4 12/21/2017   PLT 276.0 12/21/2017   GLUCOSE 111 (H) 12/21/2017   CHOL 163 12/21/2017   TRIG 161.0 (H) 12/21/2017   HDL 37.50 (L) 12/21/2017   LDLCALC 93 12/21/2017   ALT 17 12/21/2017   AST 11 12/21/2017   NA 140 12/21/2017   K 4.4 12/21/2017   CL 104 12/21/2017   CREATININE 1.01 12/21/2017   BUN 17 12/21/2017   CO2 26 12/21/2017   TSH 2.65 12/21/2017   PSA 0.90 12/21/2017   HGBA1C 5.0 12/21/2017    Patient was never admitted.  Assessment & Plan:   Tunis was seen today for establish care.  Diagnoses and all orders for this visit:  Chronic pain of both knees -     DG Knee Complete 4 Views Left; Future -     DG Knee Complete 4 Views Right; Future -     diclofenac sodium (VOLTAREN) 1 % GEL; Apply 2 g topically 4 (four) times daily. -     DG Knee Complete 4 Views Right -     DG Knee Complete 4 Views Left  Dysuria -     Urinalysis, Routine w reflex microscopic -     Urine cytology ancillary only  Morbid obesity (HCC)  Benign prostatic hyperplasia with weak urinary stream -     Urinalysis, Routine w reflex microscopic -     Urine cytology ancillary only -     doxazosin (CARDURA) 2 MG tablet; Take 1 tablet (2 mg total) by mouth daily. At night  Routine general medical examination at a health care facility  Screen for colon cancer -     Cologuard   I have changed Dashaun Ditter's diclofenac sodium. I am also having him start on doxazosin. Additionally, I am having him maintain his Cyanocobalamin (B-12 PO) and CRANBERRY PO.  Meds ordered this encounter  Medications  . doxazosin (CARDURA) 2 MG tablet    Sig: Take 1 tablet (2 mg total) by mouth daily. At night    Dispense:  30 tablet    Refill:  1   . diclofenac sodium (VOLTAREN) 1 % GEL    Sig: Apply 2 g topically 4 (four) times daily.    Dispense:  100 g    Refill:  1   Patient was given a Cologuard kit and was explained to him that he would have to clear that with his insurance first.  Obtaining knee x-rays today.  Believe that he is work with the weight loss clinic and weight loss is strongly warranted.  He cannot take Flomax because of hives with sulfur.  He will try Cardura at night.  Hypotension warnings were given.  Explained that he will be screened for chlamydia even though given his social history it is highly unlikely to be a factor is occasional dysuria.  Regarding his lower extremity swelling, I do not believe there is a serious etiology at this time.  He will try compression stockings and let me know if they are not helpful.  Follow-up: Return in about 1 month (around 03/28/2018).  Libby Maw, MD

## 2018-03-21 MED FILL — DICLOFENAC SODIUM 1% GEL: 1 | 13 days supply | Qty: 100 | Fill #1

## 2018-03-29 ENCOUNTER — Other Ambulatory Visit (HOSPITAL_COMMUNITY)
Admission: RE | Admit: 2018-03-29 | Discharge: 2018-03-29 | Disposition: A | Discharge status: Home / Self Care | Payer: No Typology Code available for payment source | Source: Ambulatory Visit | Attending: Family Medicine | Admitting: Family Medicine

## 2018-03-29 ENCOUNTER — Encounter: Payer: Self-pay | Admitting: Family Medicine

## 2018-03-29 ENCOUNTER — Ambulatory Visit (INDEPENDENT_AMBULATORY_CARE_PROVIDER_SITE_OTHER): Payer: No Typology Code available for payment source | Admitting: Family Medicine

## 2018-03-29 VITALS — BP 140/80 | HR 81 | Ht 73.0 in | Wt 375.2 lb

## 2018-03-29 DIAGNOSIS — N401 Enlarged prostate with lower urinary tract symptoms: Secondary | ICD-10-CM | POA: Insufficient documentation

## 2018-03-29 DIAGNOSIS — R319 Hematuria, unspecified: Secondary | ICD-10-CM

## 2018-03-29 DIAGNOSIS — N390 Urinary tract infection, site not specified: Secondary | ICD-10-CM

## 2018-03-29 DIAGNOSIS — R609 Edema, unspecified: Secondary | ICD-10-CM | POA: Diagnosis not present

## 2018-03-29 DIAGNOSIS — R3912 Poor urinary stream: Secondary | ICD-10-CM | POA: Diagnosis not present

## 2018-03-29 DIAGNOSIS — M25561 Pain in right knee: Secondary | ICD-10-CM

## 2018-03-29 DIAGNOSIS — G8929 Other chronic pain: Secondary | ICD-10-CM

## 2018-03-29 DIAGNOSIS — R300 Dysuria: Secondary | ICD-10-CM

## 2018-03-29 DIAGNOSIS — Z0000 Encounter for general adult medical examination without abnormal findings: Secondary | ICD-10-CM | POA: Diagnosis not present

## 2018-03-29 DIAGNOSIS — M25562 Pain in left knee: Secondary | ICD-10-CM

## 2018-03-29 DIAGNOSIS — R030 Elevated blood-pressure reading, without diagnosis of hypertension: Secondary | ICD-10-CM | POA: Insufficient documentation

## 2018-03-29 LAB — URINALYSIS, ROUTINE W REFLEX MICROSCOPIC
Bilirubin Urine: NEGATIVE
Ketones, ur: NEGATIVE
Leukocytes, UA: NEGATIVE
Nitrite: POSITIVE — AB
SPECIFIC GRAVITY, URINE: 1.02 (ref 1.000–1.030)
Urine Glucose: NEGATIVE
Urobilinogen, UA: 1 (ref 0.0–1.0)
pH: 6 (ref 5.0–8.0)

## 2018-03-29 MED ORDER — DOXAZOSIN MESYLATE 4 MG PO TABS: 4.0000 mg | ORAL_TABLET | Freq: Every day | ORAL | 0 refills | Status: DC

## 2018-03-29 MED ORDER — DICLOFENAC SODIUM 1 % TD GEL: 2.0000 g | Freq: Four times a day (QID) | TRANSDERMAL | 2 refills | Status: DC

## 2018-03-29 MED ORDER — CHLORTHALIDONE 25 MG PO TABS: 25.0000 mg | ORAL_TABLET | Freq: Every day | ORAL | 0 refills | Status: DC

## 2018-03-29 MED FILL — DOXAZOSIN MESYLATE 4 MG TAB: 4 | 90 days supply | Qty: 90 | Fill #0

## 2018-03-29 MED FILL — CHLORTHALIDONE 25 MG TABS: 25 | 90 days supply | Qty: 90 | Fill #0

## 2018-03-29 MED FILL — DICLOFENAC SODIUM 1% GEL: 1 | 25 days supply | Qty: 200 | Fill #0

## 2018-03-29 NOTE — Progress Notes (Addendum)
Subjective:  Patient ID: Justin Bowen, male    DOB: October 31, 1960  Age: 57 y.o. MRN: [id number]  CC: Follow-up   HPI Justin Bowen presents for follow-up of his BPH with treatment with low-dose Cardura.  Patient did notice some improvement with his urine flow but it is still not that much better.  He will go some nights with nocturia but not all nights.  Urine flow and force of stream have improved slightly.  Lower extremity edema continues to be an issue.  Compression stockings have been ordered.  He was unable to give [place] a urine sample last visit but will do so today.  He tells that the Voltaren gel helps his knees but he tends to run out of his prescription after 3 weeks.  Outpatient Medications Prior to Visit  Medication Sig Dispense Refill  . CRANBERRY PO Take by mouth.    . Cyanocobalamin (B-12 PO) Take by mouth.    . diclofenac sodium (VOLTAREN) 1 % GEL Apply 2 g topically 4 (four) times daily. 100 g 1  . doxazosin (CARDURA) 2 MG tablet Take 1 tablet (2 mg total) by mouth daily. At night 30 tablet 1   No facility-administered medications prior to visit.     ROS Review of Systems  Constitutional: Negative.   HENT: Negative.   Respiratory: Negative.   Cardiovascular: Negative.   Gastrointestinal: Negative.   Endocrine: Negative for polyphagia and polyuria.  Genitourinary: Positive for difficulty urinating and dysuria. Negative for decreased urine volume, discharge and penile pain.  Musculoskeletal: Positive for arthralgias and gait problem.  Allergic/Immunologic: Negative for immunocompromised state.  Neurological: Negative for weakness and numbness.  Hematological: Does not bruise/bleed easily.  Psychiatric/Behavioral: Negative.     Objective:  BP 140/80   Pulse 81   Ht 6\' 1"  (1.854 m)   Wt (!) 375 lb 4 oz (170.2 kg)   SpO2 95%   BMI 49.51 kg/m   BP Readings from Last 3 Encounters:  03/29/18 140/80  02/25/18 138/80  12/21/17 120/86    Wt Readings from Last 3  Encounters:  03/29/18 (!) 375 lb 4 oz (170.2 kg)  02/25/18 (!) 378 lb (171.5 kg)  12/21/17 (!) 370 lb (167.8 kg)    Physical Exam  Constitutional: He is oriented to person, place, and time. He appears well-developed and well-nourished. No distress.  HENT:  Head: Normocephalic and atraumatic.  Right Ear: External ear normal.  Left Ear: External ear normal.  Eyes: Right eye exhibits no discharge. Left eye exhibits no discharge. No scleral icterus.  Neck: No JVD present. No tracheal deviation present.  Pulmonary/Chest: Effort normal.  Musculoskeletal: Normal range of motion.  Neurological: He is alert and oriented to person, place, and time.  Skin: Skin is warm and dry. He is not diaphoretic.  Psychiatric: He has a normal mood and affect. His behavior is normal.    Lab Results  Component Value Date   WBC 7.1 12/21/2017   HGB 15.2 12/21/2017   HCT 43.4 12/21/2017   PLT 276.0 12/21/2017   GLUCOSE 111 (H) 12/21/2017   CHOL 163 12/21/2017   TRIG 161.0 (H) 12/21/2017   HDL 37.50 (L) 12/21/2017   LDLCALC 93 12/21/2017   ALT 17 12/21/2017   AST 11 12/21/2017   NA 140 12/21/2017   K 4.4 12/21/2017   CL 104 12/21/2017   CREATININE 1.01 12/21/2017   BUN 17 12/21/2017   CO2 26 12/21/2017   TSH 2.65 12/21/2017   PSA 0.90 12/21/2017  HGBA1C 5.0 12/21/2017    Patient was never admitted.  Assessment & Plan:   Justin BurdockRichard was seen today for follow-up.  Diagnoses and all orders for this visit:  Edema, unspecified type -     chlorthalidone (HYGROTON) 25 MG tablet; Take 1 tablet (25 mg total) by mouth daily. In the morning.  Dysuria -     Urine cytology ancillary only -     Urinalysis, Routine w reflex microscopic  Benign prostatic hyperplasia with weak urinary stream -     Urine cytology ancillary only -     Urinalysis, Routine w reflex microscopic -     doxazosin (CARDURA) 4 MG tablet; Take 1 tablet (4 mg total) by mouth daily. nightly  Routine general medical examination at  a health care facility -     Urine cytology ancillary only -     Urinalysis, Routine w reflex microscopic  Chronic pain of both knees -     diclofenac sodium (VOLTAREN) 1 % GEL; Apply 2 g topically 4 (four) times daily.  Elevated BP without diagnosis of hypertension -     chlorthalidone (HYGROTON) 25 MG tablet; Take 1 tablet (25 mg total) by mouth daily. In the morning. -     doxazosin (CARDURA) 4 MG tablet; Take 1 tablet (4 mg total) by mouth daily. nightly  Urinary tract infection with hematuria, site unspecified -     ciprofloxacin (CIPRO) 500 MG tablet; Take 1 tablet (500 mg total) by mouth 2 (two) times daily for 10 days.   I have discontinued Justin Bowen's doxazosin. I am also having Justin Bowen start on chlorthalidone, doxazosin, and ciprofloxacin. Additionally, I am having Justin Bowen maintain his Cyanocobalamin (B-12 PO), CRANBERRY PO, and diclofenac sodium.  Meds ordered this encounter  Medications  . chlorthalidone (HYGROTON) 25 MG tablet    Sig: Take 1 tablet (25 mg total) by mouth daily. In the morning.    Dispense:  90 tablet    Refill:  0  . doxazosin (CARDURA) 4 MG tablet    Sig: Take 1 tablet (4 mg total) by mouth daily. nightly    Dispense:  90 tablet    Refill:  0  . diclofenac sodium (VOLTAREN) 1 % GEL    Sig: Apply 2 g topically 4 (four) times daily.    Dispense:  200 g    Refill:  2    Patient is running out of gel every 3 weeks.  . ciprofloxacin (CIPRO) 500 MG tablet    Sig: Take 1 tablet (500 mg total) by mouth 2 (two) times daily for 10 days.    Dispense:  20 tablet    Refill:  0   We will increase dose of Cardura and add chlorthalidone.  Discussed a drop in his blood pressure and lightheadedness is being side effects of these therapies.  [place] and she will start monitoring his blood pressure on a daily basis.  Advised Justin Bowen to return in a month or sooner if his pressures start running under 100/60.  Follow-up: Return in about 1 month (around  04/29/2018).  Mliss SaxWilliam Alfred Kremer, MD

## 2018-03-30 ENCOUNTER — Encounter: Payer: Self-pay | Admitting: Family Medicine

## 2018-03-30 LAB — URINE CYTOLOGY ANCILLARY ONLY: CHLAMYDIA, DNA PROBE: NEGATIVE

## 2018-03-30 MED ORDER — CIPROFLOXACIN HCL 500 MG PO TABS: 500.0000 mg | ORAL_TABLET | Freq: Two times a day (BID) | ORAL | 0 refills | Status: AC

## 2018-03-30 NOTE — Addendum Note (Signed)
Addended by: Andrez GrimeKREMER, Joesph Marcy A on: 03/30/2018 11:27 AM   Modules accepted: Orders

## 2018-04-19 ENCOUNTER — Encounter (INDEPENDENT_AMBULATORY_CARE_PROVIDER_SITE_OTHER): Payer: Self-pay

## 2018-04-20 ENCOUNTER — Encounter (INDEPENDENT_AMBULATORY_CARE_PROVIDER_SITE_OTHER): Payer: Self-pay

## 2018-04-21 ENCOUNTER — Ambulatory Visit (INDEPENDENT_AMBULATORY_CARE_PROVIDER_SITE_OTHER): Payer: No Typology Code available for payment source | Admitting: Family Medicine

## 2018-04-21 ENCOUNTER — Encounter (INDEPENDENT_AMBULATORY_CARE_PROVIDER_SITE_OTHER): Payer: Self-pay | Admitting: Family Medicine

## 2018-04-21 VITALS — BP 115/73 | HR 73 | Ht 72.0 in | Wt 363.0 lb

## 2018-04-21 DIAGNOSIS — Z1331 Encounter for screening for depression: Secondary | ICD-10-CM | POA: Diagnosis not present

## 2018-04-21 DIAGNOSIS — Z6841 Body Mass Index (BMI) 40.0 and over, adult: Secondary | ICD-10-CM

## 2018-04-21 DIAGNOSIS — R5383 Other fatigue: Secondary | ICD-10-CM | POA: Diagnosis not present

## 2018-04-21 DIAGNOSIS — R0602 Shortness of breath: Secondary | ICD-10-CM

## 2018-04-21 DIAGNOSIS — E538 Deficiency of other specified B group vitamins: Secondary | ICD-10-CM

## 2018-04-21 DIAGNOSIS — Z0289 Encounter for other administrative examinations: Secondary | ICD-10-CM

## 2018-04-21 DIAGNOSIS — Z9189 Other specified personal risk factors, not elsewhere classified: Secondary | ICD-10-CM

## 2018-04-22 LAB — FOLATE: Folate: 8 ng/mL (ref >3.0)

## 2018-04-22 LAB — CBC WITH DIFFERENTIAL
BASOS: 0 %
Basophils Absolute: 0 10*3/uL (ref 0.0–0.2)
EOS (ABSOLUTE): 0.1 10*3/uL (ref 0.0–0.4)
EOS: 2 %
HEMATOCRIT: 41.7 % (ref 37.5–51.0)
Hemoglobin: 14.1 g/dL (ref 13.0–17.7)
IMMATURE GRANULOCYTES: 0 %
Immature Grans (Abs): 0 10*3/uL (ref 0.0–0.1)
LYMPHS ABS: 1.2 10*3/uL (ref 0.7–3.1)
Lymphs: 20 %
MCH: 29.1 pg (ref 26.6–33.0)
MCHC: 33.8 g/dL (ref 31.5–35.7)
MCV: 86 fL (ref 79–97)
MONOS ABS: 0.6 10*3/uL (ref 0.1–0.9)
Monocytes: 11 %
Neutrophils Absolute: 4 10*3/uL (ref 1.4–7.0)
Neutrophils: 67 %
RBC: 4.85 x10E6/uL (ref 4.14–5.80)
RDW: 13 % (ref 12.3–15.4)
WBC: 6 10*3/uL (ref 3.4–10.8)

## 2018-04-22 LAB — HEMOGLOBIN A1C
Est. average glucose Bld gHb Est-mCnc: 94 mg/dL
HEMOGLOBIN A1C: 4.9 % (ref 4.8–5.6)

## 2018-04-22 LAB — COMPREHENSIVE METABOLIC PANEL
A/G RATIO: 1.6 (ref 1.2–2.2)
ALK PHOS: 101 IU/L (ref 39–117)
ALT: 14 IU/L (ref 0–44)
AST: 13 IU/L (ref 0–40)
Albumin: 4.2 g/dL (ref 3.5–5.5)
BILIRUBIN TOTAL: 0.5 mg/dL (ref 0.0–1.2)
BUN/Creatinine Ratio: 15 (ref 9–20)
BUN: 16 mg/dL (ref 6–24)
CO2: 25 mmol/L (ref 20–29)
Calcium: 8.8 mg/dL (ref 8.7–10.2)
Chloride: 100 mmol/L (ref 96–106)
Creatinine, Ser: 1.1 mg/dL (ref 0.76–1.27)
GFR calc Af Amer: 86 mL/min/{1.73_m2} (ref >59)
GFR calc non Af Amer: 74 mL/min/{1.73_m2} (ref >59)
GLOBULIN, TOTAL: 2.7 g/dL (ref 1.5–4.5)
Glucose: 103 mg/dL — ABNORMAL HIGH (ref 65–99)
POTASSIUM: 3.8 mmol/L (ref 3.5–5.2)
SODIUM: 143 mmol/L (ref 134–144)
Total Protein: 6.9 g/dL (ref 6.0–8.5)

## 2018-04-22 LAB — LIPID PANEL WITH LDL/HDL RATIO
Cholesterol, Total: 159 mg/dL (ref 100–199)
HDL: 34 mg/dL — ABNORMAL LOW (ref >39)
LDL Calculated: 96 mg/dL (ref 0–99)
LDL/HDL RATIO: 2.8 ratio (ref 0.0–3.6)
TRIGLYCERIDES: 146 mg/dL (ref 0–149)
VLDL Cholesterol Cal: 29 mg/dL (ref 5–40)

## 2018-04-22 LAB — TSH: TSH: 2.18 u[IU]/mL (ref 0.450–4.500)

## 2018-04-22 LAB — VITAMIN D 25 HYDROXY (VIT D DEFICIENCY, FRACTURES): Vit D, 25-Hydroxy: 24.5 ng/mL — ABNORMAL LOW (ref 30.0–100.0)

## 2018-04-22 LAB — T4, FREE: Free T4: 1.34 ng/dL (ref 0.82–1.77)

## 2018-04-22 LAB — INSULIN, RANDOM: INSULIN: 34.7 u[IU]/mL — ABNORMAL HIGH (ref 2.6–24.9)

## 2018-04-22 LAB — T3: T3, Total: 98 ng/dL (ref 71–180)

## 2018-04-22 LAB — VITAMIN B12: Vitamin B-12: 934 pg/mL (ref 232–1245)

## 2018-04-26 NOTE — Progress Notes (Signed)
.  Office: [phone]  /  Fax: [phone]   HPI:   Chief Complaint: OBESITY  Justin Bowen (MR# [id number]) is a 57 y.o. male who presents on 04/21/2018 for obesity evaluation and treatment. Current BMI is Body mass index is 49.23 kg/m.Justin Bowen Justin Bowen has struggled with obesity for years and has been unsuccessful in either losing weight or maintaining long term weight loss. Justin Bowen attended our information session and states he is currently in the action stage of change and ready to dedicate time achieving and maintaining a healthier weight.   Justin Bowen states his family eats meals together he thinks his family will eat healthier with  him his desired weight loss is 88 lbs he has been heavy most of  his life he started gaining weight 20 years ago his heaviest weight ever was 375 lbs   Fatigue Justin Bowen feels his energy is lower than it should be. This has worsened with weight gain and has not worsened recently. Infant admits to daytime somnolence and  admits to waking up still tired. Patient is at risk for obstructive sleep apnea. Patent has a history of symptoms of daytime fatigue. Patient generally gets 8 hours of sleep per night, and states they generally have generally restful sleep. Snoring is present. Apneic episodes are not present. Epworth Sleepiness Score is 8.  Dyspnea on exertion [name] notes increasing shortness of breath with exercising and seems to be worsening over time with weight gain. He notes getting out of breath sooner with activity than he used to. This has not gotten worse recently. [name] denies orthopnea.  Vitamin B12 Deficiency Justin Bowen has a diagnosis of B12 insufficiency and notes fatigue. He is on OTC Vit B12 and he still notes fatigue. He is not a vegetarian and does not have a previous diagnosis of pernicious anemia. He does not have a history of weight loss surgery.   Depression Screen Justin Bowen's Food and Mood (modified PHQ-9) score was  Depression screen PHQ  2/9 04/21/2018  Decreased Interest 1  Down, Depressed, Hopeless 1  PHQ - 2 Score 2  Altered sleeping 0  Tired, decreased energy 0  Change in appetite 0  Feeling bad or failure about yourself  0  Trouble concentrating 0  Moving slowly or fidgety/restless 0  Suicidal thoughts 0  PHQ-9 Score 2  Difficult doing work/chores Not difficult at all   At risk for cardiovascular disease Justin Bowen is at a higher than average risk for cardiovascular disease due to obesity. He currently denies any chest pain.  ALLERGIES: Allergies  Allergen Reactions  . Sulfa Antibiotics Hives    MEDICATIONS: Current Outpatient Medications on File Prior to Visit  Medication Sig Dispense Refill  . chlorthalidone (HYGROTON) 25 MG tablet Take 1 tablet (25 mg total) by mouth daily. In the morning. 90 tablet 0  . Cyanocobalamin (B-12 PO) Take by mouth.    . diclofenac sodium (VOLTAREN) 1 % GEL Apply 2 g topically 4 (four) times daily. 200 g 2  . doxazosin (CARDURA) 4 MG tablet Take 1 tablet (4 mg total) by mouth daily. nightly 90 tablet 0  . phenazopyridine (PYRIDIUM) 97 MG tablet Take 97 mg by mouth. Take 2 tablets by mouth twice daily     No current facility-administered medications on file prior to visit.     PAST MEDICAL HISTORY: Past Medical History:  Diagnosis Date  . BPH (benign prostatic hyperplasia)   . Chicken pox   . Fatigue   . Heartburn   . Knee pain   .  Leg edema     PAST SURGICAL HISTORY: Past Surgical History:  Procedure Laterality Date  . URETHRA SURGERY      SOCIAL HISTORY: Social History   Tobacco Use  . Smoking status: Never Smoker  . Smokeless tobacco: Never Used  Substance Use Topics  . Alcohol use: Never    Frequency: Never  . Drug use: Never    FAMILY HISTORY: Family History  Problem Relation Age of Onset  . Stroke Father 28  . Cancer Brother        brain cancer   . Cancer Paternal Grandmother 42       pancreatic cancer     ROS: Review of Systems    Constitutional: Positive for malaise/fatigue. Negative for weight loss.  Respiratory: Positive for shortness of breath (with exertion).   Cardiovascular: Negative for chest pain and orthopnea.  Gastrointestinal: Positive for heartburn.  Musculoskeletal:       + Muscle or joint pain (knee)    PHYSICAL EXAM: Blood pressure 115/73, pulse 73, height 6' (1.829 m), weight (!) 363 lb (164.7 kg), SpO2 93 %. Body mass index is 49.23 kg/m. Physical Exam  Constitutional: He is oriented to person, place, and time. He appears well-developed and well-nourished.  HENT:  Head: Normocephalic and atraumatic.  Nose: Nose normal.  Eyes: EOM are normal. No scleral icterus.  Neck: Normal range of motion. Neck supple. No thyromegaly present.  Cardiovascular: Normal rate and regular rhythm.  Pulmonary/Chest: Effort normal. No respiratory distress.  Abdominal: Soft. There is no tenderness.  + Obesity  Musculoskeletal:  Range of Motion normal in all 4 extremities 1+ edema noted in bilateral lower extremities  Neurological: He is alert and oriented to person, place, and time. Coordination normal.  Skin: Skin is warm and dry.  Psychiatric: He has a normal mood and affect. His behavior is normal.  Vitals reviewed.   RECENT LABS AND TESTS: BMET    Component Value Date/Time   NA 143 04/21/2018 0920   K 3.8 04/21/2018 0920   CL 100 04/21/2018 0920   CO2 25 04/21/2018 0920   GLUCOSE 103 (H) 04/21/2018 0920   GLUCOSE 111 (H) 12/21/2017 0915   BUN 16 04/21/2018 0920   CREATININE 1.10 04/21/2018 0920   CALCIUM 8.8 04/21/2018 0920   GFRNONAA 74 04/21/2018 0920   GFRAA 86 04/21/2018 0920   Lab Results  Component Value Date   HGBA1C 4.9 04/21/2018   Lab Results  Component Value Date   INSULIN 34.7 (H) 04/21/2018   CBC    Component Value Date/Time   WBC 6.0 04/21/2018 0920   WBC 7.1 12/21/2017 0915   RBC 4.85 04/21/2018 0920   RBC 5.16 12/21/2017 0915   HGB 14.1 04/21/2018 0920   HCT 41.7  04/21/2018 0920   PLT 276.0 12/21/2017 0915   MCV 86 04/21/2018 0920   MCH 29.1 04/21/2018 0920   MCHC 33.8 04/21/2018 0920   MCHC 35.1 12/21/2017 0915   RDW 13.0 04/21/2018 0920   LYMPHSABS 1.2 04/21/2018 0920   MONOABS 0.5 12/21/2017 0915   EOSABS 0.1 04/21/2018 0920   BASOSABS 0.0 04/21/2018 0920   Iron/TIBC/Ferritin/ %Sat No results found for: IRON, TIBC, FERRITIN, IRONPCTSAT Lipid Panel     Component Value Date/Time   CHOL 159 04/21/2018 0920   TRIG 146 04/21/2018 0920   HDL 34 (L) 04/21/2018 0920   CHOLHDL 4 12/21/2017 0915   VLDL 32.2 12/21/2017 0915   LDLCALC 96 04/21/2018 0920   Hepatic Function Panel  Component Value Date/Time   PROT 6.9 04/21/2018 0920   ALBUMIN 4.2 04/21/2018 0920   AST 13 04/21/2018 0920   ALT 14 04/21/2018 0920   ALKPHOS 101 04/21/2018 0920   BILITOT 0.5 04/21/2018 0920   BILIDIR 0.1 12/21/2017 0915      Component Value Date/Time   TSH 2.180 04/21/2018 0920   Vitamin D No recent labs  ECG  shows NSR with a rate of 86 BPM INDIRECT CALORIMETER done today shows a VO2 of 285 and a REE of 1981. His calculated basal metabolic rate is 1610 thus his basal metabolic rate is worse than expected.    ASSESSMENT AND PLAN: Other fatigue - Plan: EKG 12-Lead, CBC With Differential, Comprehensive metabolic panel, Folate, Hemoglobin A1c, Insulin, random, Lipid Panel With LDL/HDL Ratio, T3, T4, free, TSH, VITAMIN D 25 Hydroxy (Vit-D Deficiency, Fractures)  Shortness of breath on exertion - Plan: CBC With Differential  B12 nutritional deficiency - Plan: Vitamin B12  Depression screening  At risk for heart disease  Class 3 severe obesity with serious comorbidity and body mass index (BMI) of 45.0 to 49.9 in adult, unspecified obesity type (HCC)  PLAN:  Fatigue Gaston was informed that his fatigue may be related to obesity, depression or many other causes. Labs will be ordered, and in the meanwhile Seanpaul has agreed to work on diet,  exercise and weight loss to help with fatigue. Proper sleep hygiene was discussed including the need for 7-8 hours of quality sleep each night. A sleep study was not ordered based on symptoms and Epworth score.  Dyspnea on exertion Amram's shortness of breath appears to be obesity related and exercise induced. He has agreed to work on weight loss and gradually increase exercise to treat his exercise induced shortness of breath. If Khrystian follows our instructions and loses weight without improvement of his shortness of breath, we will plan to refer to pulmonology. We will monitor this condition regularly. Kidus agrees to this plan.  Vitamin B12 Deficiency Eusevio will work on increasing B12 rich foods in his diet. B12 supplementation was not prescribed today. We will check labs and Arianna agrees to follow up with our clinic in 2 weeks.  Depression Screen Alexandr had a negative depression screening. Depression is commonly associated with obesity and often results in emotional eating behaviors. We will monitor this closely and work on CBT to help improve the non-hunger eating patterns. Referral to Psychology may be required if no improvement is seen as he continues in our clinic.  Cardiovascular risk counselling Jaaron was given extended (15 minutes) coronary artery disease prevention counseling today. He is 57 y.o. male and has risk factors for heart disease including obesity. We discussed intensive lifestyle modifications today with an emphasis on specific weight loss instructions and strategies. Pt was also informed of the importance of increasing exercise and decreasing saturated fats to help prevent heart disease.  Obesity Vannie is currently in the action stage of change and his goal is to continue with weight loss efforts He has agreed to follow the Category 3 plan Rohan has been instructed to work up to a goal of 150 minutes of combined cardio and strengthening exercise per week for  weight loss and overall health benefits. We discussed the following Behavioral Modification Strategies today: increasing lean protein intake, decreasing simple carbohydrates  and work on meal planning and easy cooking plans  Everet has agreed to follow up with our clinic in 2 weeks. He was informed of the importance of frequent  follow up visits to maximize his success with intensive lifestyle modifications for his multiple health conditions. He was informed we would discuss his lab results at his next visit unless there is a critical issue that needs to be addressed sooner. Marqus agreed to keep his next visit at the agreed upon time to discuss these results.    OBESITY BEHAVIORAL INTERVENTION VISIT  Today's visit was # 1   Starting weight: 363 lbs Starting date: 04/21/18 Today's weight : 363 lbs  Today's date: 04/21/2018 Total lbs lost to date: 0    ASK: We discussed the diagnosis of obesity with Roxanne Gatesichard Hartinger today and Gerlene Burdockichard agreed to give us permission to discuss obesity behavioral modification therapy today.  ASSESS: Gerlene BurdockRichard has the diagnosis of obesity and his BMI today is 49.22 Gerlene BurdockRichard is in the action stage of change   ADVISE: Gerlene BurdockRichard was educated on the multiple health risks of obesity as well as the benefit of weight loss to improve his health. He was advised of the need for long term treatment and the importance of lifestyle modifications to improve his current health and to decrease his risk of future health problems.  AGREE: Multiple dietary modification options and treatment options were discussed and  Quince agreed to follow the recommendations documented in the above note.  ARRANGE: Gerlene BurdockRichard was educated on the importance of frequent visits to treat obesity as outlined per CMS and USPSTF guidelines and agreed to schedule his next follow up appointment today.   I, Burt KnackSharon Martin, am acting as transcriptionist for Quillian Quincearen Regine Christian, MD   I have reviewed the above  documentation for accuracy and completeness, and I agree with the above. -Quillian Quincearen Kymari Nuon, MD

## 2018-04-27 MED FILL — CIPROFLOXACIN HCL 500 MG TA: 500 | 10 days supply | Qty: 20 | Fill #0

## 2018-04-29 ENCOUNTER — Ambulatory Visit: Payer: No Typology Code available for payment source | Admitting: Family Medicine

## 2018-05-04 ENCOUNTER — Ambulatory Visit (INDEPENDENT_AMBULATORY_CARE_PROVIDER_SITE_OTHER): Payer: No Typology Code available for payment source | Admitting: Family Medicine

## 2018-05-04 VITALS — BP 106/69 | HR 66 | Temp 97.5°F | Ht 72.0 in | Wt 353.0 lb

## 2018-05-04 DIAGNOSIS — E559 Vitamin D deficiency, unspecified: Secondary | ICD-10-CM

## 2018-05-04 DIAGNOSIS — Z9189 Other specified personal risk factors, not elsewhere classified: Secondary | ICD-10-CM

## 2018-05-04 DIAGNOSIS — R7303 Prediabetes: Secondary | ICD-10-CM | POA: Diagnosis not present

## 2018-05-04 DIAGNOSIS — Z6841 Body Mass Index (BMI) 40.0 and over, adult: Secondary | ICD-10-CM

## 2018-05-04 MED ORDER — VITAMIN D (ERGOCALCIFEROL) 1.25 MG (50000 UNIT) PO CAPS: 50000.0000 [IU] | ORAL_CAPSULE | ORAL | 0 refills | Status: DC

## 2018-05-04 MED ORDER — METFORMIN HCL 500 MG PO TABS: 500.0000 mg | ORAL_TABLET | Freq: Every day | ORAL | 0 refills | Status: DC

## 2018-05-04 MED FILL — VIT D2 1.25 MG (50,000 UNIT: 1.25 MG | 28 days supply | Qty: 4 | Fill #0

## 2018-05-04 MED FILL — metFORMIN HCL 500 MG TABS: 500 | 30 days supply | Qty: 30 | Fill #0

## 2018-05-04 NOTE — Progress Notes (Signed)
Office: [phone]  /  Fax: [phone]   HPI:   Chief Complaint: OBESITY Justin Bowen is here to discuss his progress with his obesity treatment plan. He is on the Category 3 plan and is following his eating plan approximately 90 to 95 % of the time. He states he is exercising 0 minutes 0 times per week. Justin Bowen did well with weight loss on his Category 3 plan. His hunger was controlled and he was happy with his food options. He did have some cravings, but was able to deal with this reasonably well.  His weight is (!) 353 lb (160.1 kg) today and has had a weight loss of 10 pounds over a period of 2 weeks since his last visit. He has lost 10 lbs since starting treatment with [place].  Vitamin D deficiency Justin Bowen has a new diagnosis of vitamin D deficiency. He is not currently taking vit D. He admits fatigue and denies nausea, vomiting or muscle weakness.  Pre-Diabetes Justin Bowen has a new diagnosis of pre-diabetes based on his elevated Hgb A1c and was informed this puts him at greater risk of developing diabetes. He is not taking metformin currently and continues to work on diet and exercise to decrease risk of diabetes. He admits polyphagia.  At risk for diabetes Justin Bowen is at higher than average risk for developing diabetes due to his pre-diabetes and obesity.   ALLERGIES: Allergies  Allergen Reactions  . Sulfa Antibiotics Hives    MEDICATIONS: Current Outpatient Medications on File Prior to Visit  Medication Sig Dispense Refill  . chlorthalidone (HYGROTON) 25 MG tablet Take 1 tablet (25 mg total) by mouth daily. In the morning. 90 tablet 0  . ciprofloxacin (CIPRO) 500 MG tablet Take 500 mg by mouth 2 (two) times daily.    . Cyanocobalamin (B-12 PO) Take by mouth.    . diclofenac sodium (VOLTAREN) 1 % GEL Apply 2 g topically 4 (four) times daily. 200 g 2  . doxazosin (CARDURA) 4 MG tablet Take 1 tablet (4 mg total) by mouth daily. nightly 90 tablet 0  . phenazopyridine (PYRIDIUM) 97 MG  tablet Take 97 mg by mouth. Take 2 tablets by mouth twice daily     No current facility-administered medications on file prior to visit.     PAST MEDICAL HISTORY: Past Medical History:  Diagnosis Date  . BPH (benign prostatic hyperplasia)   . Chicken pox   . Fatigue   . Heartburn   . Knee pain   . Leg edema     PAST SURGICAL HISTORY: Past Surgical History:  Procedure Laterality Date  . URETHRA SURGERY      SOCIAL HISTORY: Social History   Tobacco Use  . Smoking status: Never Smoker  . Smokeless tobacco: Never Used  Substance Use Topics  . Alcohol use: Never    Frequency: Never  . Drug use: Never    FAMILY HISTORY: Family History  Problem Relation Age of Onset  . Stroke Father 87  . Cancer Brother        brain cancer   . Cancer Paternal Grandmother 23       pancreatic cancer     ROS: Review of Systems  Constitutional: Positive for malaise/fatigue and weight loss.  Gastrointestinal: Negative for nausea and vomiting.       Positive for polyphagia.  Musculoskeletal:       Negative for muscle weakness.  Endo/Heme/Allergies:       Positive for polyphagia.    PHYSICAL EXAM: Blood pressure 106/69,  pulse 66, temperature (!) 97.5 F (36.4 C), temperature source Oral, height 6' (1.829 m), weight (!) 353 lb (160.1 kg), SpO2 95 %. Body mass index is 47.88 kg/m. Physical Exam  Constitutional: He is oriented to person, place, and time. He appears well-developed and well-nourished.  Cardiovascular: Normal rate.  Pulmonary/Chest: Effort normal.  Musculoskeletal: Normal range of motion.  Neurological: He is oriented to person, place, and time.  Skin: Skin is warm and dry.  Psychiatric: He has a normal mood and affect. His behavior is normal.  Vitals reviewed.   RECENT LABS AND TESTS: BMET    Component Value Date/Time   NA 143 04/21/2018 0920   K 3.8 04/21/2018 0920   CL 100 04/21/2018 0920   CO2 25 04/21/2018 0920   GLUCOSE 103 (H) 04/21/2018 0920    GLUCOSE 111 (H) 12/21/2017 0915   BUN 16 04/21/2018 0920   CREATININE 1.10 04/21/2018 0920   CALCIUM 8.8 04/21/2018 0920   GFRNONAA 74 04/21/2018 0920   GFRAA 86 04/21/2018 0920   Lab Results  Component Value Date   HGBA1C 4.9 04/21/2018   HGBA1C 5.0 12/21/2017   Lab Results  Component Value Date   INSULIN 34.7 (H) 04/21/2018   CBC    Component Value Date/Time   WBC 6.0 04/21/2018 0920   WBC 7.1 12/21/2017 0915   RBC 4.85 04/21/2018 0920   RBC 5.16 12/21/2017 0915   HGB 14.1 04/21/2018 0920   HCT 41.7 04/21/2018 0920   PLT 276.0 12/21/2017 0915   MCV 86 04/21/2018 0920   MCH 29.1 04/21/2018 0920   MCHC 33.8 04/21/2018 0920   MCHC 35.1 12/21/2017 0915   RDW 13.0 04/21/2018 0920   LYMPHSABS 1.2 04/21/2018 0920   MONOABS 0.5 12/21/2017 0915   EOSABS 0.1 04/21/2018 0920   BASOSABS 0.0 04/21/2018 0920   Iron/TIBC/Ferritin/ %Sat No results found for: IRON, TIBC, FERRITIN, IRONPCTSAT Lipid Panel     Component Value Date/Time   CHOL 159 04/21/2018 0920   TRIG 146 04/21/2018 0920   HDL 34 (L) 04/21/2018 0920   CHOLHDL 4 12/21/2017 0915   VLDL 32.2 12/21/2017 0915   LDLCALC 96 04/21/2018 0920   Hepatic Function Panel     Component Value Date/Time   PROT 6.9 04/21/2018 0920   ALBUMIN 4.2 04/21/2018 0920   AST 13 04/21/2018 0920   ALT 14 04/21/2018 0920   ALKPHOS 101 04/21/2018 0920   BILITOT 0.5 04/21/2018 0920   BILIDIR 0.1 12/21/2017 0915      Component Value Date/Time   TSH 2.180 04/21/2018 0920   TSH 2.65 12/21/2017 0915   Results for Justin Bowen, Justin Bowen (MRN [id number]) as of 05/04/2018 17:31  Ref. Range 04/21/2018 09:20  Vitamin D, 25-Hydroxy Latest Ref Range: 30.0 - 100.0 ng/mL 24.5 (L)   ASSESSMENT AND PLAN: Vitamin D deficiency - Plan: Vitamin D, Ergocalciferol, (DRISDOL) 50000 units CAPS capsule  Prediabetes - Plan: metFORMIN (GLUCOPHAGE) 500 MG tablet  At risk for diabetes mellitus  Class 3 severe obesity with serious comorbidity and body mass index  (BMI) of 45.0 to 49.9 in adult, unspecified obesity type (HCC)  PLAN:  Vitamin D Deficiency Justin Bowen was informed that low vitamin D levels contributes to fatigue and are associated with obesity, breast, and colon cancer. He agrees to start to take prescription Vit D @50 ,000 IU every week #4 with no refills and will follow up for routine testing of vitamin D, at least 2-3 times per year. He was informed of the risk of over-replacement of  vitamin D and agrees to not increase his dose unless he discusses this with [place] first. Justin Bowen agrees to follow up in 2 weeks.  Pre-Diabetes Justin Bowen will continue to work on weight loss, exercise, and decreasing simple carbohydrates in his diet to help decrease the risk of diabetes. We discussed metformin including benefits and risks. He was informed that eating too many simple carbohydrates or too many calories at one sitting increases the likelihood of GI side effects. Justin Bowen agrees to start metformin 500mg  qAM #30 with no refills and a prescription was not written today. Justin Bowen agreed to follow up with [place] as directed to monitor hisprogress in 2 weeks.  Diabetes risk counseling Justin Bowen was given extended (15 minutes) diabetes prevention counseling today. He is 57 y.o. male and has risk factors for diabetes including pre-diabetes and obesity. We discussed intensive lifestyle modifications today with an emphasis on weight loss as well as increasing exercise and decreasing simple carbohydrates in his diet.  Obesity Justin Bowen is currently in the action stage of change. As such, his goal is to continue with weight loss efforts. He has agreed to follow the Category 3 plan. Justin Bowen has been instructed to work up to a goal of 150 minutes of combined cardio and strengthening exercise per week for weight loss and overall health benefits. We discussed the following Behavioral Modification Strategies today: increasing lean protein intake and decreasing simple carbohydrates.    Justin Bowen has agreed to follow up with our clinic in 2 weeks. He was informed of the importance of frequent follow up visits to maximize his success with intensive lifestyle modifications for his multiple health conditions.   OBESITY BEHAVIORAL INTERVENTION VISIT  Today's visit was # 2   Starting weight: 363 lbs Starting date: 04/21/18 Today's weight : Weight: (!) 353 lb (160.1 kg)  Today's date: 05/04/2018 Total lbs lost to date: 10  ASK: We discussed the diagnosis of obesity with Roxanne Gates today and Justin Bowen agreed to give [place] permission to discuss obesity behavioral modification therapy today.  ASSESS: Tayton has the diagnosis of obesity and his BMI today is 47.87. Jousha is in the action stage of change.   ADVISE: Goble was educated on the multiple health risks of obesity as well as the benefit of weight loss to improve his health. He was advised of the need for long term treatment and the importance of lifestyle modifications to improve his current health and to decrease his risk of future health problems.  AGREE: Multiple dietary modification options and treatment options were discussed and Jamie agreed to follow the recommendations documented in the above note.  ARRANGE: Brack was educated on the importance of frequent visits to treat obesity as outlined per CMS and USPSTF guidelines and agreed to schedule his next follow up appointment today.  I, Kirke Corin, am acting as transcriptionist for Wilder Glade, MD  I have reviewed the above documentation for accuracy and completeness, and I agree with the above. -Quillian Quince, MD

## 2018-05-09 ENCOUNTER — Encounter: Payer: Self-pay | Admitting: Family Medicine

## 2018-05-09 ENCOUNTER — Ambulatory Visit (INDEPENDENT_AMBULATORY_CARE_PROVIDER_SITE_OTHER): Payer: No Typology Code available for payment source | Admitting: Family Medicine

## 2018-05-09 VITALS — BP 102/70 | HR 67 | Ht 72.0 in | Wt 354.5 lb

## 2018-05-09 DIAGNOSIS — R3912 Poor urinary stream: Secondary | ICD-10-CM | POA: Diagnosis not present

## 2018-05-09 DIAGNOSIS — Z23 Encounter for immunization: Secondary | ICD-10-CM

## 2018-05-09 DIAGNOSIS — N401 Enlarged prostate with lower urinary tract symptoms: Secondary | ICD-10-CM

## 2018-05-09 DIAGNOSIS — R609 Edema, unspecified: Secondary | ICD-10-CM | POA: Diagnosis not present

## 2018-05-09 DIAGNOSIS — R030 Elevated blood-pressure reading, without diagnosis of hypertension: Secondary | ICD-10-CM | POA: Diagnosis not present

## 2018-05-09 LAB — MICROALBUMIN / CREATININE URINE RATIO
CREATININE, U: 101.7 mg/dL
Microalb Creat Ratio: 3 mg/g (ref 0.0–30.0)
Microalb, Ur: 3.1 mg/dL — ABNORMAL HIGH (ref 0.0–1.9)

## 2018-05-09 MED ORDER — CHLORTHALIDONE 25 MG PO TABS: 12.5000 mg | ORAL_TABLET | Freq: Every day | ORAL | 1 refills | Status: DC

## 2018-05-09 NOTE — Progress Notes (Signed)
Subjective:  Patient ID: Justin Bowen, male    DOB: 22-Feb-1961  Age: 57 y.o. MRN: [id number]  CC: Follow-up   HPI Justin Bowen presents for follow-up of his edema.  This is been greatly improved with the chlorthalidone.  He has felt lightheaded when standing up though.  Blood pressures have been running in the 100-120/70-80 range.  He tells me that he has had some type of TURP procedure in the past.  This greatly helped his urine flow.  He does feel slightly obstructed with his urine stream.  4 mg of Cardura at night has really seem to help.  He has been seen at the weight loss clinic and lost 10 pounds.  They did check routine blood work.  Blood sugar was slightly elevated with a low normal hemoglobin A1c.  They elected to start him on Glucophage 500 mg for prediabetes.  His vitamin D was level measured low.  He was started on high-dose vitamin D.  He does have a slightly pruritic rash on his forehead that came about status post working around poison ivy in his yard.  Outpatient Medications Prior to Visit  Medication Sig Dispense Refill  . Cyanocobalamin (B-12 PO) Take by mouth.    . diclofenac sodium (VOLTAREN) 1 % GEL Apply 2 g topically 4 (four) times daily. 200 g 2  . doxazosin (CARDURA) 4 MG tablet Take 1 tablet (4 mg total) by mouth daily. nightly 90 tablet 0  . metFORMIN (GLUCOPHAGE) 500 MG tablet Take 1 tablet (500 mg total) by mouth daily with breakfast. 30 tablet 0  . phenazopyridine (PYRIDIUM) 97 MG tablet Take 97 mg by mouth. Take 2 tablets by mouth twice daily    . Vitamin D, Ergocalciferol, (DRISDOL) 50000 units CAPS capsule Take 1 capsule (50,000 Units total) by mouth every 7 (seven) days. 4 capsule 0  . chlorthalidone (HYGROTON) 25 MG tablet Take 1 tablet (25 mg total) by mouth daily. In the morning. 90 tablet 0  . ciprofloxacin (CIPRO) 500 MG tablet Take 500 mg by mouth 2 (two) times daily.     No facility-administered medications prior to visit.     ROS Review of Systems    Constitutional: Negative.   Respiratory: Negative.   Cardiovascular: Negative.   Gastrointestinal: Negative.   Genitourinary: Positive for difficulty urinating. Negative for decreased urine volume and dysuria.  Musculoskeletal: Negative for gait problem and myalgias.  Skin: Positive for rash. Negative for wound.  Allergic/Immunologic: Negative for immunocompromised state.  Neurological: Positive for light-headedness. Negative for dizziness and headaches.  Hematological: Does not bruise/bleed easily.  Psychiatric/Behavioral: Negative.     Objective:  BP 102/70   Pulse 67   Ht 6' (1.829 m)   Wt (!) 354 lb 8 oz (160.8 kg)   SpO2 93%   BMI 48.08 kg/m   BP Readings from Last 3 Encounters:  05/09/18 102/70  05/04/18 106/69  04/21/18 115/73    Wt Readings from Last 3 Encounters:  05/09/18 (!) 354 lb 8 oz (160.8 kg)  05/04/18 (!) 353 lb (160.1 kg)  04/21/18 (!) 363 lb (164.7 kg)    Physical Exam  Constitutional: He is oriented to person, place, and time. He appears well-developed and well-nourished. No distress.  HENT:  Head: Normocephalic and atraumatic.  Right Ear: External ear normal.  Left Ear: External ear normal.  Mouth/Throat: Oropharynx is clear and moist. No oropharyngeal exudate.  Eyes: Pupils are equal, round, and reactive to light. Conjunctivae and EOM are normal. Right eye exhibits  no discharge. Left eye exhibits no discharge. No scleral icterus.  Neck: Neck supple. No JVD present. No tracheal deviation present. No thyromegaly present.  Cardiovascular: Normal rate, regular rhythm and normal heart sounds.  Pulmonary/Chest: Effort normal and breath sounds normal.  Neurological: He is alert and oriented to person, place, and time.  Skin: Skin is warm and dry. He is not diaphoretic.     Psychiatric: He has a normal mood and affect. His behavior is normal.    Lab Results  Component Value Date   WBC 6.0 04/21/2018   HGB 14.1 04/21/2018   HCT 41.7 04/21/2018    PLT 276.0 12/21/2017   GLUCOSE 103 (H) 04/21/2018   CHOL 159 04/21/2018   TRIG 146 04/21/2018   HDL 34 (L) 04/21/2018   LDLCALC 96 04/21/2018   ALT 14 04/21/2018   AST 13 04/21/2018   NA 143 04/21/2018   K 3.8 04/21/2018   CL 100 04/21/2018   CREATININE 1.10 04/21/2018   BUN 16 04/21/2018   CO2 25 04/21/2018   TSH 2.180 04/21/2018   PSA 0.90 12/21/2017   HGBA1C 4.9 04/21/2018    No results found.  Assessment & Plan:   Justin Bowen was seen today for follow-up.  Diagnoses and all orders for this visit:  Benign prostatic hyperplasia with weak urinary stream -     Ambulatory referral to Urology  Elevated BP without diagnosis of hypertension -     chlorthalidone (HYGROTON) 25 MG tablet; Take 0.5 tablets (12.5 mg total) by mouth daily. -     Microalbumin / creatinine urine ratio  Edema, unspecified type -     chlorthalidone (HYGROTON) 25 MG tablet; Take 0.5 tablets (12.5 mg total) by mouth daily.   I have discontinued Aum Feig's chlorthalidone and ciprofloxacin. I am also having him start on chlorthalidone. Additionally, I am having him maintain his Cyanocobalamin (B-12 PO), doxazosin, diclofenac sodium, phenazopyridine, Vitamin D (Ergocalciferol), and metFORMIN.  Meds ordered this encounter  Medications  . chlorthalidone (HYGROTON) 25 MG tablet    Sig: Take 0.5 tablets (12.5 mg total) by mouth daily.    Dispense:  30 tablet    Refill:  1   We will continue chlorthalidone at decreased dose of 12.5 mg.  Cardura is continued at 4 mg.  Wife will check and record blood pressures.  Have referred for urology follow-up for his LUTS symptoms.  Follow-up: Return in about 6 weeks (around 06/20/2018).  Mliss Sax, MD

## 2018-05-16 ENCOUNTER — Encounter: Payer: Self-pay | Admitting: Family Medicine

## 2018-05-16 ENCOUNTER — Ambulatory Visit (INDEPENDENT_AMBULATORY_CARE_PROVIDER_SITE_OTHER): Payer: No Typology Code available for payment source | Admitting: Family Medicine

## 2018-05-16 VITALS — BP 100/70 | HR 67 | Temp 97.6°F | Ht 72.0 in | Wt 361.0 lb

## 2018-05-16 DIAGNOSIS — E559 Vitamin D deficiency, unspecified: Secondary | ICD-10-CM | POA: Diagnosis not present

## 2018-05-16 DIAGNOSIS — L255 Unspecified contact dermatitis due to plants, except food: Secondary | ICD-10-CM

## 2018-05-16 MED ORDER — METHYLPREDNISOLONE SODIUM SUCC 125 MG IJ SOLR
125.0000 mg | Freq: Once | INTRAMUSCULAR | Status: AC
  Administered 2018-05-16: 125 mg via INTRAMUSCULAR

## 2018-05-16 MED ORDER — PREDNISONE 10 MG (48) PO TBPK: ORAL_TABLET | ORAL | 0 refills | Status: DC

## 2018-05-16 MED FILL — predniSONE 10 MG (48) TBPK: 10 | 12 days supply | Qty: 48 | Fill #0

## 2018-05-16 NOTE — Patient Instructions (Signed)
Poison Ivy Dermatitis Poison ivy dermatitis is redness and soreness (inflammation) of the skin. It is caused by a chemical that is found on the leaves of the poison ivy plant. You may also have itching, a rash, and blisters. Symptoms often clear up in 1-2 weeks. You may get this condition by touching a poison ivy plant. You can also get it by touching something that has the chemical on it. This may include animals or objects that have come in contact with the plant. Follow these instructions at home: General instructions  Take or apply over-the-counter and prescription medicines only as told by your doctor.  If you touch poison ivy, wash your skin with soap and cold water right away.  Use hydrocortisone creams or calamine lotion as needed to help with itching.  Take oatmeal baths as needed. Use colloidal oatmeal. You can get this at a pharmacy or grocery store. Follow the instructions on the package.  Do not scratch or rub your skin.  While you have the rash, wash your clothes right after you wear them. Prevention  Know what poison ivy looks like so you can avoid it. This plant has three leaves with flowering branches on a single stem. The leaves are glossy. They have uneven edges that come to a point at the front.  If you have touched poison ivy, wash with soap and water right away. Be sure to wash under your fingernails.  When hiking or camping, wear long pants, a long-sleeved shirt, tall socks, and hiking boots. You can also use a lotion on your skin that helps to prevent contact with the chemical on the plant.  If you think that your clothes or outdoor gear came in contact with poison ivy, rinse them off with a garden hose before you bring them inside your house. Contact a doctor if:  You have open sores in the rash area.  You have more redness, swelling, or pain in the affected area.  You have redness that spreads beyond the rash area.  You have fluid, blood, or pus coming from  the affected area.  You have a fever.  You have a rash over a large area of your body.  You have a rash on your eyes, mouth, or genitals.  Your rash does not get better after a few days. Get help right away if:  Your face swells or your eyes swell shut.  You have trouble breathing.  You have trouble swallowing. This information is not intended to replace advice given to you by your health care provider. Make sure you discuss any questions you have with your health care provider. Document Released: 08/22/2010 Document Revised: 12/26/2015 Document Reviewed: 12/26/2014 Elsevier Interactive Patient Education  2018 [employer]  

## 2018-05-16 NOTE — Progress Notes (Signed)
Subjective:  Patient ID: Justin Bowen, male    DOB: 05/05/1961  Age: 57 y.o. MRN: [id number]  CC: Poison Ivy   HPI Justin Bowen presents for pruritic rash about his body that is involved the peri-orbital area with rash on his hands face arms and legs.  He had been cleaning out some poison vine around his house 5 to 6 days ago.  The vine was dad.  He has decreased the chlorthalidone to 12.5 mg daily.  His wife is measured his blood pressure consistently in the 110/70-80 range.  He has been taking his high-dose vitamin D weekly.  Outpatient Medications Prior to Visit  Medication Sig Dispense Refill  . chlorthalidone (HYGROTON) 25 MG tablet Take 0.5 tablets (12.5 mg total) by mouth daily. 30 tablet 1  . CRANBERRY EXTRACT PO Take by mouth.    . Cyanocobalamin (B-12 PO) Take by mouth.    . diclofenac sodium (VOLTAREN) 1 % GEL Apply 2 g topically 4 (four) times daily. 200 g 2  . doxazosin (CARDURA) 4 MG tablet Take 1 tablet (4 mg total) by mouth daily. nightly 90 tablet 0  . metFORMIN (GLUCOPHAGE) 500 MG tablet Take 1 tablet (500 mg total) by mouth daily with breakfast. 30 tablet 0  . phenazopyridine (PYRIDIUM) 97 MG tablet Take 97 mg by mouth. Take 2 tablets by mouth twice daily    . Vitamin D, Ergocalciferol, (DRISDOL) 50000 units CAPS capsule Take 1 capsule (50,000 Units total) by mouth every 7 (seven) days. 4 capsule 0   No facility-administered medications prior to visit.     ROS Review of Systems  Constitutional: Negative.   HENT: Negative.   Respiratory: Negative.   Cardiovascular: Negative.   Gastrointestinal: Negative.   Skin: Positive for color change and rash.  Neurological: Negative for light-headedness and headaches.  Hematological: Does not bruise/bleed easily.  Psychiatric/Behavioral: Negative.     Objective:  BP 100/70   Pulse 67   Temp 97.6 F (36.4 C) (Oral)   Ht 6' (1.829 m)   Wt (!) 361 lb (163.7 kg)   SpO2 95%   BMI 48.96 kg/m   BP Readings from Last 3  Encounters:  05/16/18 100/70  05/09/18 102/70  05/04/18 106/69    Wt Readings from Last 3 Encounters:  05/16/18 (!) 361 lb (163.7 kg)  05/09/18 (!) 354 lb 8 oz (160.8 kg)  05/04/18 (!) 353 lb (160.1 kg)    Physical Exam  Constitutional: He is oriented to person, place, and time. He appears well-developed and well-nourished. No distress.  HENT:  Head: Normocephalic and atraumatic.  Right Ear: External ear normal.  Left Ear: External ear normal.  Mouth/Throat: No oropharyngeal exudate.  Eyes: Pupils are equal, round, and reactive to light. Conjunctivae and EOM are normal. Right eye exhibits no discharge. Left eye exhibits no discharge. No scleral icterus.    Neck: Neck supple. No JVD present. No tracheal deviation present.  Pulmonary/Chest: Effort normal.  Neurological: He is alert and oriented to person, place, and time.  Skin: Rash noted. He is not diaphoretic. There is erythema.     Psychiatric: He has a normal mood and affect. His behavior is normal.    Lab Results  Component Value Date   WBC 6.0 04/21/2018   HGB 14.1 04/21/2018   HCT 41.7 04/21/2018   PLT 276.0 12/21/2017   GLUCOSE 103 (H) 04/21/2018   CHOL 159 04/21/2018   TRIG 146 04/21/2018   HDL 34 (L) 04/21/2018   LDLCALC 96 04/21/2018  ALT 14 04/21/2018   AST 13 04/21/2018   NA 143 04/21/2018   K 3.8 04/21/2018   CL 100 04/21/2018   CREATININE 1.10 04/21/2018   BUN 16 04/21/2018   CO2 25 04/21/2018   TSH 2.180 04/21/2018   PSA 0.90 12/21/2017   HGBA1C 4.9 04/21/2018   MICROALBUR 3.1 (H) 05/09/2018    No results found.  Assessment & Plan:   Irish was seen today for poison ivy.  Diagnoses and all orders for this visit:  Rhus dermatitis -     methylPREDNISolone sodium succinate (SOLU-MEDROL) 125 mg/2 mL injection 125 mg -     predniSONE (STERAPRED UNI-PAK 48 TAB) 10 MG (48) TBPK tablet; To start tomorrow. Pharm to instruct a 12 day taper.   I am having Justin Bowen start on predniSONE. I  am also having him maintain his Cyanocobalamin (B-12 PO), doxazosin, diclofenac sodium, phenazopyridine, Vitamin D (Ergocalciferol), metFORMIN, chlorthalidone, and CRANBERRY EXTRACT PO. We will continue to administer methylPREDNISolone sodium succinate.  Meds ordered this encounter  Medications  . methylPREDNISolone sodium succinate (SOLU-MEDROL) 125 mg/2 mL injection 125 mg  . predniSONE (STERAPRED UNI-PAK 48 TAB) 10 MG (48) TBPK tablet    Sig: To start tomorrow. Pharm to instruct a 12 day taper.    Dispense:  48 tablet    Refill:  0   Patient will start his 12-day Dosepak tomorrow.  Discussed side effects to include increased appetite and irritability.  Patient will continue the low-dose chlorthalidone with blood pressure checks.  Encouraged him to continue vitamin D supplementation.  He will follow-up as previously directed.  Follow-up: No follow-ups on file.  Mliss Sax, MD

## 2018-05-17 ENCOUNTER — Encounter (INDEPENDENT_AMBULATORY_CARE_PROVIDER_SITE_OTHER): Payer: Self-pay | Admitting: Physician Assistant

## 2018-05-17 ENCOUNTER — Ambulatory Visit (INDEPENDENT_AMBULATORY_CARE_PROVIDER_SITE_OTHER): Payer: No Typology Code available for payment source | Admitting: Physician Assistant

## 2018-05-17 VITALS — BP 118/74 | HR 60 | Temp 97.9°F | Ht 72.0 in | Wt 355.0 lb

## 2018-05-17 DIAGNOSIS — Z6841 Body Mass Index (BMI) 40.0 and over, adult: Secondary | ICD-10-CM | POA: Diagnosis not present

## 2018-05-17 DIAGNOSIS — E559 Vitamin D deficiency, unspecified: Secondary | ICD-10-CM | POA: Diagnosis not present

## 2018-05-17 DIAGNOSIS — Z9189 Other specified personal risk factors, not elsewhere classified: Secondary | ICD-10-CM

## 2018-05-17 DIAGNOSIS — R7303 Prediabetes: Secondary | ICD-10-CM

## 2018-05-17 MED ORDER — VITAMIN D (ERGOCALCIFEROL) 1.25 MG (50000 UNIT) PO CAPS: 50000.0000 [IU] | ORAL_CAPSULE | ORAL | 0 refills | Status: DC

## 2018-05-18 NOTE — Progress Notes (Signed)
Office: [phone]  /  Fax: [phone]   HPI:   Chief Complaint: OBESITY Justin Bowen is here to discuss his progress with his obesity treatment plan. He is on the Category 3 plan and is following his eating plan approximately 80 % of the time. He states he is exercising 0 minutes 0 times per week. Justin Bowen reports that he has not been measuring his protein and believes that he has not been getting enough. He has eaten out twice and states that he ate salad, however the chicken portion was very small.  His weight is (!) 355 lb (161 kg) today and has gained 2 pounds since his last visit. He has lost 8 lbs since starting treatment with [place].  Vitamin D Deficiency Justin Bowen has a diagnosis of vitamin D deficiency. He is currently taking prescription Vit D and denies nausea, vomiting or muscle weakness.  At risk for osteopenia and osteoporosis Justin Bowen is at higher risk of osteopenia and osteoporosis due to vitamin D deficiency.   Pre-Diabetes Justin Bowen has a diagnosis of pre-diabetes based on his elevated Hgb A1c and was informed this puts him at greater risk of developing diabetes. He denies nausea, vomiting, or diarrhea on metformin and continues to work on diet and exercise to decrease risk of diabetes. He denies polyphagia or hypoglycemia.  ALLERGIES: Allergies  Allergen Reactions  . Sulfa Antibiotics Hives    MEDICATIONS: Current Outpatient Medications on File Prior to Visit  Medication Sig Dispense Refill  . chlorthalidone (HYGROTON) 25 MG tablet Take 0.5 tablets (12.5 mg total) by mouth daily. 30 tablet 1  . CRANBERRY EXTRACT PO Take by mouth.    . Cyanocobalamin (B-12 PO) Take by mouth.    . diclofenac sodium (VOLTAREN) 1 % GEL Apply 2 g topically 4 (four) times daily. 200 g 2  . doxazosin (CARDURA) 4 MG tablet Take 1 tablet (4 mg total) by mouth daily. nightly 90 tablet 0  . metFORMIN (GLUCOPHAGE) 500 MG tablet Take 1 tablet (500 mg total) by mouth daily with breakfast. 30 tablet 0    . phenazopyridine (PYRIDIUM) 97 MG tablet Take 97 mg by mouth. Take 2 tablets by mouth twice daily    . predniSONE (STERAPRED UNI-PAK 48 TAB) 10 MG (48) TBPK tablet To start tomorrow. Pharm to instruct a 12 day taper. 48 tablet 0   No current facility-administered medications on file prior to visit.     PAST MEDICAL HISTORY: Past Medical History:  Diagnosis Date  . BPH (benign prostatic hyperplasia)   . Chicken pox   . Fatigue   . Heartburn   . Knee pain   . Leg edema     PAST SURGICAL HISTORY: Past Surgical History:  Procedure Laterality Date  . URETHRA SURGERY      SOCIAL HISTORY: Social History   Tobacco Use  . Smoking status: Never Smoker  . Smokeless tobacco: Never Used  Substance Use Topics  . Alcohol use: Never    Frequency: Never  . Drug use: Never    FAMILY HISTORY: Family History  Problem Relation Age of Onset  . Stroke Father 85  . Cancer Brother        brain cancer   . Cancer Paternal Grandmother 78       pancreatic cancer     ROS: Review of Systems  Constitutional: Negative for weight loss.  Gastrointestinal: Negative for diarrhea, nausea and vomiting.  Musculoskeletal:       Negative muscle weakness  Endo/Heme/Allergies:  Negative polyphagia Negative hypoglycemia    PHYSICAL EXAM: Blood pressure 118/74, pulse 60, temperature 97.9 F (36.6 C), temperature source Oral, height 6' (1.829 m), weight (!) 355 lb (161 kg), SpO2 94 %. Body mass index is 48.15 kg/m. Physical Exam  Constitutional: He is oriented to person, place, and time. He appears well-developed and well-nourished.  Cardiovascular: Normal rate.  Pulmonary/Chest: Effort normal.  Musculoskeletal: Normal range of motion.  Neurological: He is oriented to person, place, and time.  Skin: Skin is warm and dry.  Psychiatric: He has a normal mood and affect. His behavior is normal.  Vitals reviewed.   RECENT LABS AND TESTS: BMET    Component Value Date/Time   NA 143  04/21/2018 0920   K 3.8 04/21/2018 0920   CL 100 04/21/2018 0920   CO2 25 04/21/2018 0920   GLUCOSE 103 (H) 04/21/2018 0920   GLUCOSE 111 (H) 12/21/2017 0915   BUN 16 04/21/2018 0920   CREATININE 1.10 04/21/2018 0920   CALCIUM 8.8 04/21/2018 0920   GFRNONAA 74 04/21/2018 0920   GFRAA 86 04/21/2018 0920   Lab Results  Component Value Date   HGBA1C 4.9 04/21/2018   HGBA1C 5.0 12/21/2017   Lab Results  Component Value Date   INSULIN 34.7 (H) 04/21/2018   CBC    Component Value Date/Time   WBC 6.0 04/21/2018 0920   WBC 7.1 12/21/2017 0915   RBC 4.85 04/21/2018 0920   RBC 5.16 12/21/2017 0915   HGB 14.1 04/21/2018 0920   HCT 41.7 04/21/2018 0920   PLT 276.0 12/21/2017 0915   MCV 86 04/21/2018 0920   MCH 29.1 04/21/2018 0920   MCHC 33.8 04/21/2018 0920   MCHC 35.1 12/21/2017 0915   RDW 13.0 04/21/2018 0920   LYMPHSABS 1.2 04/21/2018 0920   MONOABS 0.5 12/21/2017 0915   EOSABS 0.1 04/21/2018 0920   BASOSABS 0.0 04/21/2018 0920   Iron/TIBC/Ferritin/ %Sat No results found for: IRON, TIBC, FERRITIN, IRONPCTSAT Lipid Panel     Component Value Date/Time   CHOL 159 04/21/2018 0920   TRIG 146 04/21/2018 0920   HDL 34 (L) 04/21/2018 0920   CHOLHDL 4 12/21/2017 0915   VLDL 32.2 12/21/2017 0915   LDLCALC 96 04/21/2018 0920   Hepatic Function Panel     Component Value Date/Time   PROT 6.9 04/21/2018 0920   ALBUMIN 4.2 04/21/2018 0920   AST 13 04/21/2018 0920   ALT 14 04/21/2018 0920   ALKPHOS 101 04/21/2018 0920   BILITOT 0.5 04/21/2018 0920   BILIDIR 0.1 12/21/2017 0915      Component Value Date/Time   TSH 2.180 04/21/2018 0920   TSH 2.65 12/21/2017 0915  Results for Justin Bowen, Justin Bowen (MRN [id number]) as of 05/18/2018 13:33  Ref. Range 04/21/2018 09:20  Vitamin D, 25-Hydroxy Latest Ref Range: 30.0 - 100.0 ng/mL 24.5 (L)    ASSESSMENT AND PLAN: Vitamin D deficiency - Plan: Vitamin D, Ergocalciferol, (DRISDOL) 50000 units CAPS capsule  Prediabetes  At risk for  osteoporosis  Class 3 severe obesity with serious comorbidity and body mass index (BMI) of 45.0 to 49.9 in adult, unspecified obesity type (HCC)  PLAN:  Vitamin D Deficiency Justin Bowen was informed that low vitamin D levels contributes to fatigue and are associated with obesity, breast, and colon cancer. Justin Bowen agrees to continue taking prescription Vit D @50 ,000 IU every week #4 and we will refill for 1 month. He will follow up for routine testing of vitamin D, at least 2-3 times per year. He was informed  of the risk of over-replacement of vitamin D and agrees to not increase his dose unless he discusses this with [place] first. Justin Bowen agrees to follow up with our clinic in 2 weeks.  At risk for osteopenia and osteoporosis Justin Bowen was given extended (15 minutes) osteoporosis prevention counseling today. Desiderio is at risk for osteopenia and osteoporsis due to his vitamin D deficiency. He was encouraged to take his vitamin D and follow his higher calcium diet and increase strengthening exercise to help strengthen his bones and decrease his risk of osteopenia and osteoporosis.  Pre-Diabetes Justin Bowen will continue to work on weight loss, diet, exercise, and decreasing simple carbohydrates in his diet to help decrease the risk of diabetes. We dicussed metformin including benefits and risks. He was informed that eating too many simple carbohydrates or too many calories at one sitting increases the likelihood of GI side effects. Justin Bowen agrees to continue taking metformin, and he agrees to follow up with our clinic in 2 weeks as directed to monitor his progress.  Obesity Justin Bowen is currently in the action stage of change. As such, his goal is to continue with weight loss efforts He has agreed to follow the Category 3 plan Justin Bowen has been instructed to work up to a goal of 150 minutes of combined cardio and strengthening exercise per week for weight loss and overall health benefits. We discussed the  following Behavioral Modification Strategies today: increasing lean protein intake, work on meal planning and easy cooking plans and ways to avoid boredom eating   Justin Bowen has agreed to follow up with our clinic in 2 weeks. He was informed of the importance of frequent follow up visits to maximize his success with intensive lifestyle modifications for his multiple health conditions.   OBESITY BEHAVIORAL INTERVENTION VISIT  Today's visit was # 3   Starting weight: 363 lbs Starting date: 04/21/18 Today's weight : 355 lbs  Today's date: 05/17/2018 Total lbs lost to date: 8    ASK: We discussed the diagnosis of obesity with Justin Bowen today and Justin Bowen agreed to give [place] permission to discuss obesity behavioral modification therapy today.  ASSESS: Justin Bowen has the diagnosis of obesity and his BMI today is 48.14 Justin Bowen is in the action stage of change   ADVISE: Lamon was educated on the multiple health risks of obesity as well as the benefit of weight loss to improve his health. He was advised of the need for long term treatment and the importance of lifestyle modifications.  AGREE: Multiple dietary modification options and treatment options were discussed and  Darcell agreed to the above obesity treatment plan.  Trude Mcburney, am acting as transcriptionist for Alois Cliche, PA-C I, Alois Cliche, PA-C have reviewed above note and agree with its content

## 2018-05-30 ENCOUNTER — Ambulatory Visit (INDEPENDENT_AMBULATORY_CARE_PROVIDER_SITE_OTHER): Payer: No Typology Code available for payment source | Admitting: Physician Assistant

## 2018-05-30 ENCOUNTER — Encounter (INDEPENDENT_AMBULATORY_CARE_PROVIDER_SITE_OTHER): Payer: Self-pay | Admitting: Physician Assistant

## 2018-05-30 VITALS — BP 119/73 | HR 82 | Temp 98.1°F | Ht 72.0 in | Wt 345.0 lb

## 2018-05-30 DIAGNOSIS — Z9189 Other specified personal risk factors, not elsewhere classified: Secondary | ICD-10-CM | POA: Diagnosis not present

## 2018-05-30 DIAGNOSIS — E88819 Insulin resistance, unspecified: Secondary | ICD-10-CM

## 2018-05-30 DIAGNOSIS — E8881 Metabolic syndrome: Secondary | ICD-10-CM | POA: Diagnosis not present

## 2018-05-30 DIAGNOSIS — E559 Vitamin D deficiency, unspecified: Secondary | ICD-10-CM

## 2018-05-30 DIAGNOSIS — Z6841 Body Mass Index (BMI) 40.0 and over, adult: Secondary | ICD-10-CM

## 2018-05-30 MED ORDER — METFORMIN HCL 500 MG PO TABS: 500.0000 mg | ORAL_TABLET | Freq: Every day | ORAL | 0 refills | Status: DC

## 2018-05-30 MED FILL — metFORMIN HCL 500 MG TABS: 500 | 30 days supply | Qty: 30 | Fill #0

## 2018-05-30 MED FILL — VIT D2 1.25 MG (50,000 UNIT: 1.25 MG | 28 days supply | Qty: 4 | Fill #0

## 2018-05-31 NOTE — Progress Notes (Signed)
Office: [phone]  /  Fax: [phone]   HPI:   Chief Complaint: OBESITY Nyheem is here to discuss his progress with his obesity treatment plan. He is on the Category 3 plan and is following his eating plan approximately 85 % of the time. He states he is exercising 0 minutes 0 times per week. Coolidge did very well with weight loss. He reports adding protein to his meals and eating much closer to the plan. He expresses an interest in journaling today but is not ready.  His weight is (!) 345 lb (156.5 kg) today and has had a weight loss of 10 pounds over a period of 2 weeks since his last visit. He has lost 18 lbs since starting treatment with [place].  Vitamin D Deficiency Isamar has a diagnosis of vitamin D deficiency. He is currently taking prescription Vit D and denies nausea, vomiting or muscle weakness.  At risk for osteopenia and osteoporosis August is at higher risk of osteopenia and osteoporosis due to vitamin D deficiency.   Insulin Resistance Mahari has a diagnosis of insulin resistance based on his elevated fasting insulin level >5. Although Jolan's blood glucose readings are still under good control, insulin resistance puts him at greater risk of metabolic syndrome and diabetes. He is taking metformin currently and continues to work on diet and exercise to decrease risk of diabetes. He denies polyphagia.  ALLERGIES: Allergies  Allergen Reactions  . Sulfa Antibiotics Hives    MEDICATIONS: Current Outpatient Medications on File Prior to Visit  Medication Sig Dispense Refill  . chlorthalidone (HYGROTON) 25 MG tablet Take 0.5 tablets (12.5 mg total) by mouth daily. 30 tablet 1  . CRANBERRY EXTRACT PO Take by mouth.    . Cyanocobalamin (B-12 PO) Take by mouth.    . diclofenac sodium (VOLTAREN) 1 % GEL Apply 2 g topically 4 (four) times daily. 200 g 2  . doxazosin (CARDURA) 4 MG tablet Take 1 tablet (4 mg total) by mouth daily. nightly 90 tablet 0  . phenazopyridine  (PYRIDIUM) 97 MG tablet Take 97 mg by mouth. Take 2 tablets by mouth twice daily    . Vitamin D, Ergocalciferol, (DRISDOL) 50000 units CAPS capsule Take 1 capsule (50,000 Units total) by mouth every 7 (seven) days. 4 capsule 0   No current facility-administered medications on file prior to visit.     PAST MEDICAL HISTORY: Past Medical History:  Diagnosis Date  . BPH (benign prostatic hyperplasia)   . Chicken pox   . Fatigue   . Heartburn   . Knee pain   . Leg edema     PAST SURGICAL HISTORY: Past Surgical History:  Procedure Laterality Date  . URETHRA SURGERY      SOCIAL HISTORY: Social History   Tobacco Use  . Smoking status: Never Smoker  . Smokeless tobacco: Never Used  Substance Use Topics  . Alcohol use: Never    Frequency: Never  . Drug use: Never    FAMILY HISTORY: Family History  Problem Relation Age of Onset  . Stroke Father 7  . Cancer Brother        brain cancer   . Cancer Paternal Grandmother 10       pancreatic cancer     ROS: Review of Systems  Constitutional: Positive for weight loss.  Gastrointestinal: Negative for nausea and vomiting.  Musculoskeletal:       Negative muscle weakness  Endo/Heme/Allergies:       Negative polyphagia    PHYSICAL EXAM: Blood  pressure 119/73, pulse 82, temperature 98.1 F (36.7 C), temperature source Oral, height 6' (1.829 m), weight (!) 345 lb (156.5 kg), SpO2 94 %. Body mass index is 46.79 kg/m. Physical Exam  Constitutional: He is oriented to person, place, and time. He appears well-developed and well-nourished.  Cardiovascular: Normal rate.  Pulmonary/Chest: Effort normal.  Musculoskeletal: Normal range of motion.  Neurological: He is oriented to person, place, and time.  Skin: Skin is warm and dry.  Psychiatric: He has a normal mood and affect. His behavior is normal.  Vitals reviewed.   RECENT LABS AND TESTS: BMET    Component Value Date/Time   NA 143 04/21/2018 0920   K 3.8 04/21/2018 0920    CL 100 04/21/2018 0920   CO2 25 04/21/2018 0920   GLUCOSE 103 (H) 04/21/2018 0920   GLUCOSE 111 (H) 12/21/2017 0915   BUN 16 04/21/2018 0920   CREATININE 1.10 04/21/2018 0920   CALCIUM 8.8 04/21/2018 0920   GFRNONAA 74 04/21/2018 0920   GFRAA 86 04/21/2018 0920   Lab Results  Component Value Date   HGBA1C 4.9 04/21/2018   HGBA1C 5.0 12/21/2017   Lab Results  Component Value Date   INSULIN 34.7 (H) 04/21/2018   CBC    Component Value Date/Time   WBC 6.0 04/21/2018 0920   WBC 7.1 12/21/2017 0915   RBC 4.85 04/21/2018 0920   RBC 5.16 12/21/2017 0915   HGB 14.1 04/21/2018 0920   HCT 41.7 04/21/2018 0920   PLT 276.0 12/21/2017 0915   MCV 86 04/21/2018 0920   MCH 29.1 04/21/2018 0920   MCHC 33.8 04/21/2018 0920   MCHC 35.1 12/21/2017 0915   RDW 13.0 04/21/2018 0920   LYMPHSABS 1.2 04/21/2018 0920   MONOABS 0.5 12/21/2017 0915   EOSABS 0.1 04/21/2018 0920   BASOSABS 0.0 04/21/2018 0920   Iron/TIBC/Ferritin/ %Sat No results found for: IRON, TIBC, FERRITIN, IRONPCTSAT Lipid Panel     Component Value Date/Time   CHOL 159 04/21/2018 0920   TRIG 146 04/21/2018 0920   HDL 34 (L) 04/21/2018 0920   CHOLHDL 4 12/21/2017 0915   VLDL 32.2 12/21/2017 0915   LDLCALC 96 04/21/2018 0920   Hepatic Function Panel     Component Value Date/Time   PROT 6.9 04/21/2018 0920   ALBUMIN 4.2 04/21/2018 0920   AST 13 04/21/2018 0920   ALT 14 04/21/2018 0920   ALKPHOS 101 04/21/2018 0920   BILITOT 0.5 04/21/2018 0920   BILIDIR 0.1 12/21/2017 0915      Component Value Date/Time   TSH 2.180 04/21/2018 0920   TSH 2.65 12/21/2017 0915  Results for Giambalvo, Gerlene Burdock (MRN [id number]) as of 05/31/2018 17:24  Ref. Range 04/21/2018 09:20  Vitamin D, 25-Hydroxy Latest Ref Range: 30.0 - 100.0 ng/mL 24.5 (L)    ASSESSMENT AND PLAN: Vitamin D deficiency  At risk for osteoporosis  Insulin resistance - Plan: metFORMIN (GLUCOPHAGE) 500 MG tablet  Class 3 severe obesity with serious  comorbidity and body mass index (BMI) of 45.0 to 49.9 in adult, unspecified obesity type (HCC)  PLAN:  Vitamin D Deficiency Jovanni was informed that low vitamin D levels contributes to fatigue and are associated with obesity, breast, and colon cancer. Buster agrees to continue taking prescription Vit D @50 ,000 IU every week and will follow up for routine testing of vitamin D, at least 2-3 times per year. He was informed of the risk of over-replacement of vitamin D and agrees to not increase his dose unless he discusses this with  [place] first. Juanjesus agrees to follow up with our clinic in 3 weeks.  At risk for osteopenia and osteoporosis Duward was given extended (15 minutes) osteoporosis prevention counseling today. Deep is at risk for osteopenia and osteoporsis due to his vitamin D deficiency. He was encouraged to take his vitamin D and follow his higher calcium diet and increase strengthening exercise to help strengthen his bones and decrease his risk of osteopenia and osteoporosis.  Insulin Resistance Dimitriy will continue to work on weight loss, exercise, and decreasing simple carbohydrates in his diet to help decrease the risk of diabetes. We dicussed metformin including benefits and risks. He was informed that eating too many simple carbohydrates or too many calories at one sitting increases the likelihood of GI side effects. Verlyn agrees to continue taking metformin 500 mg q AM #30 and we will refill for 1 month. Juanya agrees to follow up with our clinic in 3 weeks as directed to monitor his progress.  Obesity Deryl is currently in the action stage of change. As such, his goal is to continue with weight loss efforts He has agreed to follow the Category 3 plan Gavinn has been instructed to work up to a goal of 150 minutes of combined cardio and strengthening exercise per week for weight loss and overall health benefits. We discussed the following Behavioral Modification Strategies  today: work on meal planning and easy cooking plans and ways to avoid boredom eating   Sheena has agreed to follow up with our clinic in 3 weeks. He was informed of the importance of frequent follow up visits to maximize his success with intensive lifestyle modifications for his multiple health conditions.   OBESITY BEHAVIORAL INTERVENTION VISIT  Today's visit was # 4   Starting weight: 363 lbs Starting date: 04/21/18 Today's weight : 345 lbs  Today's date: 05/30/2018 Total lbs lost to date: 36    ASK: We discussed the diagnosis of obesity with Roxanne Gates today and Gerlene Burdock agreed to give [place] permission to discuss obesity behavioral modification therapy today.  ASSESS: Verlon has the diagnosis of obesity and his BMI today is 46.78 Geovanny is in the action stage of change   ADVISE: Kris was educated on the multiple health risks of obesity as well as the benefit of weight loss to improve his health. He was advised of the need for long term treatment and the importance of lifestyle modifications.  AGREE: Multiple dietary modification options and treatment options were discussed and  Manvir agreed to the above obesity treatment plan.  Trude Mcburney, am acting as transcriptionist for Alois Cliche, PA-C I, Alois Cliche, PA-C have reviewed above note and agree with its content

## 2018-06-02 ENCOUNTER — Encounter: Payer: Self-pay | Admitting: Family Medicine

## 2018-06-02 DIAGNOSIS — G8929 Other chronic pain: Secondary | ICD-10-CM

## 2018-06-02 DIAGNOSIS — M25561 Pain in right knee: Principal | ICD-10-CM

## 2018-06-02 DIAGNOSIS — M25562 Pain in left knee: Principal | ICD-10-CM

## 2018-06-02 MED ORDER — DICLOFENAC SODIUM 1 % TD GEL: 2.0000 g | Freq: Four times a day (QID) | TRANSDERMAL | 2 refills | Status: DC

## 2018-06-02 MED FILL — DICLOFENAC SODIUM 1% GEL: 1 | 25 days supply | Qty: 200 | Fill #0

## 2018-06-16 ENCOUNTER — Encounter (INDEPENDENT_AMBULATORY_CARE_PROVIDER_SITE_OTHER): Payer: Self-pay | Admitting: Physician Assistant

## 2018-06-20 ENCOUNTER — Ambulatory Visit: Payer: No Typology Code available for payment source | Admitting: Family Medicine

## 2018-06-22 ENCOUNTER — Ambulatory Visit (INDEPENDENT_AMBULATORY_CARE_PROVIDER_SITE_OTHER): Payer: No Typology Code available for payment source | Admitting: Physician Assistant

## 2018-06-22 VITALS — BP 109/65 | HR 75 | Temp 97.5°F | Ht 72.0 in | Wt 343.0 lb

## 2018-06-22 DIAGNOSIS — Z6841 Body Mass Index (BMI) 40.0 and over, adult: Secondary | ICD-10-CM

## 2018-06-22 DIAGNOSIS — E8881 Metabolic syndrome: Secondary | ICD-10-CM | POA: Diagnosis not present

## 2018-06-22 DIAGNOSIS — Z9189 Other specified personal risk factors, not elsewhere classified: Secondary | ICD-10-CM

## 2018-06-22 DIAGNOSIS — E559 Vitamin D deficiency, unspecified: Secondary | ICD-10-CM | POA: Diagnosis not present

## 2018-06-22 MED ORDER — VITAMIN D (ERGOCALCIFEROL) 1.25 MG (50000 UNIT) PO CAPS: 50000.0000 [IU] | ORAL_CAPSULE | ORAL | 0 refills | Status: DC

## 2018-06-22 MED ORDER — METFORMIN HCL 500 MG PO TABS: 500.0000 mg | ORAL_TABLET | Freq: Every day | ORAL | 0 refills | Status: DC

## 2018-06-22 MED FILL — VIT D2 1.25 MG (50,000 UNIT: 1.25 MG | 28 days supply | Qty: 4 | Fill #0

## 2018-06-22 MED FILL — metFORMIN HCL 500 MG TABS: 500 | 30 days supply | Qty: 30 | Fill #0

## 2018-06-23 ENCOUNTER — Ambulatory Visit (INDEPENDENT_AMBULATORY_CARE_PROVIDER_SITE_OTHER): Payer: No Typology Code available for payment source | Admitting: Family Medicine

## 2018-06-23 ENCOUNTER — Encounter: Payer: Self-pay | Admitting: Family Medicine

## 2018-06-23 VITALS — BP 110/70 | HR 78 | Ht 72.0 in | Wt 343.0 lb

## 2018-06-23 DIAGNOSIS — R030 Elevated blood-pressure reading, without diagnosis of hypertension: Secondary | ICD-10-CM

## 2018-06-23 DIAGNOSIS — N401 Enlarged prostate with lower urinary tract symptoms: Secondary | ICD-10-CM | POA: Diagnosis not present

## 2018-06-23 DIAGNOSIS — R609 Edema, unspecified: Secondary | ICD-10-CM

## 2018-06-23 DIAGNOSIS — R3912 Poor urinary stream: Secondary | ICD-10-CM

## 2018-06-23 DIAGNOSIS — Z23 Encounter for immunization: Secondary | ICD-10-CM

## 2018-06-23 MED ORDER — TAMSULOSIN HCL 0.4 MG PO CAPS: 0.4000 mg | ORAL_CAPSULE | Freq: Every day | ORAL | 3 refills | Status: DC

## 2018-06-23 MED FILL — TAMSULOSIN HCL 0.4 MG CAP: 0.4 | 30 days supply | Qty: 30 | Fill #0

## 2018-06-23 NOTE — Progress Notes (Signed)
Established Patient Office Visit  Subjective:  Patient ID: Justin Bowen, male    DOB: April 18, 1961  Age: 57 y.o. MRN: [id number]  CC:  Chief Complaint  Patient presents with  . Follow-up    HPI Ashok Sawaya presents for a follow up on his blood pressure. He has been taking 12.5 mg of the chlorthalidone and 4 mg of the Cardura. His wife has been checking his blood pressures at home and they have been running around 110/65. Today his blood pressure is 110/70. He has been taking his medications as directed and does not need any refills at this time. He would like to hold off on the colonoscopy at this time, but will proceed with getting his tetanus shot today.  Patient will be checking with his insurance company to assure Cologuard coverage.  His blood pressure has been running in the 110/60-70 range.  He has not experienced lightheadedness to any extent.  His lower extremity edema is much improved.  Urine flow is okay with the Cardura at 4 mg daily.  Upon further reflection he believes that he had more of a generalized rash when he took sulfur some years ago.  He definitely did not experience whelps pruritic and moved around.  Past Medical History:  Diagnosis Date  . BPH (benign prostatic hyperplasia)   . Chicken pox   . Fatigue   . Heartburn   . Knee pain   . Leg edema     Past Surgical History:  Procedure Laterality Date  . URETHRA SURGERY      Family History  Problem Relation Age of Onset  . Stroke Father 38  . Cancer Brother        brain cancer   . Cancer Paternal Grandmother 62       pancreatic cancer     Social History   Socioeconomic History  . Marital status: Married    Spouse name: Rohit Deloria  . Number of children: Not on file  . Years of education: Not on file  . Highest education level: Not on file  Occupational History  . Not on file  Social Needs  . Financial resource strain: Not on file  . Food insecurity:    Worry: Not on file    Inability: Not on  file  . Transportation needs:    Medical: Not on file    Non-medical: Not on file  Tobacco Use  . Smoking status: Never Smoker  . Smokeless tobacco: Never Used  Substance and Sexual Activity  . Alcohol use: Never    Frequency: Never  . Drug use: Never  . Sexual activity: Not on file  Lifestyle  . Physical activity:    Days per week: Not on file    Minutes per session: Not on file  . Stress: Not on file  Relationships  . Social connections:    Talks on phone: Not on file    Gets together: Not on file    Attends religious service: Not on file    Active member of club or organization: Not on file    Attends meetings of clubs or organizations: Not on file    Relationship status: Not on file  . Intimate partner violence:    Fear of current or ex partner: Not on file    Emotionally abused: Not on file    Physically abused: Not on file    Forced sexual activity: Not on file  Other Topics Concern  . 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 chlorthalidone (HYGROTON) 25 MG tablet Take 0.5 tablets (12.5 mg total) by mouth daily. 30 tablet 1  . CRANBERRY EXTRACT PO Take by mouth.    . Cyanocobalamin (B-12 PO) Take by mouth.    . diclofenac sodium (VOLTAREN) 1 % GEL Apply 2 g topically 4 (four) times daily. 200 g 2  . metFORMIN (GLUCOPHAGE) 500 MG tablet Take 1 tablet (500 mg total) by mouth daily with breakfast. 30 tablet 0  . phenazopyridine (PYRIDIUM) 97 MG tablet Take 97 mg by mouth. Take 2 tablets by mouth twice daily    . Vitamin D, Ergocalciferol, (DRISDOL) 1.25 MG (50000 UT) CAPS capsule Take 1 capsule (50,000 Units total) by mouth every 7 (seven) days. 4 capsule 0  . doxazosin (CARDURA) 4 MG tablet Take 1 tablet (4 mg total) by mouth daily. nightly 90 tablet 0   No facility-administered medications prior to visit.     Allergies  Allergen  Reactions  . Sulfa Antibiotics Rash    ROS Review of Systems  Constitutional: Negative.   HENT: Negative.   Respiratory: Negative.   Cardiovascular: Negative.   Gastrointestinal: Negative.   Genitourinary: Negative for decreased urine volume, difficulty urinating and frequency.  Skin: Negative for pallor and rash.  Neurological: Negative for light-headedness and headaches.  Hematological: Does not bruise/bleed easily.  Psychiatric/Behavioral: Negative.       Objective:    Physical Exam  Constitutional: He is oriented to person, place, and time. He appears well-developed and well-nourished. No distress.  HENT:  Head: Normocephalic and atraumatic.  Right Ear: External ear normal.  Left Ear: External ear normal.  Eyes: Right eye exhibits no discharge. Left eye exhibits no discharge. Scleral icterus is present.  Neck: No tracheal deviation present.  Cardiovascular: Normal rate, regular rhythm and normal heart sounds.  Pulmonary/Chest: Effort normal.  Musculoskeletal: He exhibits edema (trace).  Neurological: He is alert and oriented to person, place, and time.  Skin: Skin is warm and dry. He is not diaphoretic.  Psychiatric: He has a normal mood and affect. His behavior is normal.    BP 110/70 (BP Location: Left Arm, Patient Position: Sitting, Cuff Size: Large)   Pulse 78   Ht 6' (1.829 m)   Wt (!) 343 lb (155.6 kg)   SpO2 96%   BMI 46.52 kg/m  Wt Readings from Last 3 Encounters:  06/23/18 (!) 343 lb (155.6 kg)  06/22/18 (!) 343 lb (155.6 kg)  05/30/18 (!) 345 lb (156.5 kg)     There are no preventive care reminders to display for this patient.  There are no preventive care reminders to display for this patient.  Lab Results  Component Value Date   TSH 2.180 04/21/2018   Lab Results  Component Value Date   WBC 6.0 04/21/2018   HGB 14.1 04/21/2018   HCT 41.7 04/21/2018   MCV 86 04/21/2018   PLT 276.0 12/21/2017   Lab Results  Component Value Date   NA  143 04/21/2018   K 3.8 04/21/2018   CO2 25 04/21/2018   GLUCOSE 103 (H) 04/21/2018   BUN 16 04/21/2018   CREATININE 1.10 04/21/2018   BILITOT 0.5 04/21/2018   ALKPHOS 101 04/21/2018   AST 13 04/21/2018   ALT 14 04/21/2018   PROT 6.9 04/21/2018   ALBUMIN 4.2 04/21/2018  CALCIUM 8.8 04/21/2018   GFR 80.74 12/21/2017   Lab Results  Component Value Date   CHOL 159 04/21/2018   Lab Results  Component Value Date   HDL 34 (L) 04/21/2018   Lab Results  Component Value Date   LDLCALC 96 04/21/2018   Lab Results  Component Value Date   TRIG 146 04/21/2018   Lab Results  Component Value Date   CHOLHDL 4 12/21/2017   Lab Results  Component Value Date   HGBA1C 4.9 04/21/2018      Assessment & Plan:   Problem List Items Addressed This Visit      Other   Benign prostatic hyperplasia with weak urinary stream   Relevant Medications   tamsulosin (FLOMAX) 0.4 MG CAPS capsule   Edema   Elevated BP without diagnosis of hypertension - Primary    Other Visit Diagnoses    Need for tetanus, diphtheria, and acellular pertussis (Tdap) vaccine       Relevant Orders   Tdap vaccine greater than or equal to 7yo IM (Completed)      Meds ordered this encounter  Medications  . tamsulosin (FLOMAX) 0.4 MG CAPS capsule    Sig: Take 1 capsule (0.4 mg total) by mouth daily.    Dispense:  30 capsule    Refill:  3  Patient will discontinue Cardura.  We will try Flomax 0.4 mg daily.  He will report any rash that he may develop on this medication.  Will continue blood pressure checks.  Once again stressed the importance of maintaining a diastolic pressure greater than 60.  Continue the chlorthalidone 12.5 mg daily.  Up in 1 month.  Follow-up: Return in about 4 weeks (around 07/21/2018).

## 2018-06-23 NOTE — Patient Instructions (Signed)
Tamsulosin capsules What is this medicine? TAMSULOSIN (tam SOO loe sin) is used to treat enlargement of the prostate gland in men, a condition called benign prostatic hyperplasia or BPH. It is not for use in women. It works by relaxing muscles in the prostate and bladder neck. This improves urine flow and reduces BPH symptoms. This medicine may be used for other purposes; ask your health care provider or pharmacist if you have questions. COMMON BRAND NAME(S): Flomax What should I tell my health care provider before I take this medicine? They need to know if you have any of the following conditions: -advanced kidney disease -advanced liver disease -low blood pressure -prostate cancer -an unusual or allergic reaction to tamsulosin, sulfa drugs, other medicines, foods, dyes, or preservatives -pregnant or trying to get pregnant -breast-feeding How should I use this medicine? Take this medicine by mouth about 30 minutes after the same meal every day. Follow the directions on the prescription label. Swallow the capsules whole with a glass of water. Do not crush, chew, or open capsules. Do not take your medicine more often than directed. Do not stop taking your medicine unless your doctor tells you to. Talk to your pediatrician regarding the use of this medicine in children. Special care may be needed. Overdosage: If you think you have taken too much of this medicine contact a poison control center or emergency room at once. NOTE: This medicine is only for you. Do not share this medicine with others. What if I miss a dose? If you miss a dose, take it as soon as you can. If it is almost time for your next dose, take only that dose. Do not take double or extra doses. If you stop taking your medicine for several days or more, ask your doctor or health care professional what dose you should start back on. What may interact with this medicine? -cimetidine -fluoxetine -ketoconazole -medicines for  erectile disfunction like sildenafil, tadalafil, vardenafil -medicines for high blood pressure -other alpha-blockers like alfuzosin, doxazosin, phentolamine, phenoxybenzamine, prazosin, terazosin -warfarin This list may not describe all possible interactions. Give your health care provider a list of all the medicines, herbs, non-prescription drugs, or dietary supplements you use. Also tell them if you smoke, drink alcohol, or use illegal drugs. Some items may interact with your medicine. What should I watch for while using this medicine? Visit your doctor or health care professional for regular check ups. You will need lab work done before you start this medicine and regularly while you are taking it. Check your blood pressure as directed. Ask your health care professional what your blood pressure should be, and when you should contact him or her. This medicine may make you feel dizzy or lightheaded. This is more likely to happen after the first dose, after an increase in dose, or during hot weather or exercise. Drinking alcohol and taking some medicines can make this worse. Do not drive, use machinery, or do anything that needs mental alertness until you know how this medicine affects you. Do not sit or stand up quickly. If you begin to feel dizzy, sit down until you feel better. These effects can decrease once your body adjusts to the medicine. Contact your doctor or health care professional right away if you have an erection that lasts longer than 4 hours or if it becomes painful. This may be a sign of a serious problem and must be treated right away to prevent permanent damage. If you are thinking of having cataract   surgery, tell your eye surgeon that you have taken this medicine. What side effects may I notice from receiving this medicine? Side effects that you should report to your doctor or health care professional as soon as possible: -allergic reactions like skin rash or itching, hives, swelling  of the lips, mouth, tongue, or throat -breathing problems -change in vision -feeling faint or lightheaded -irregular heartbeat -prolonged or painful erection -weakness Side effects that usually do not require medical attention (report to your doctor or health care professional if they continue or are bothersome): -back pain -change in sex drive or performance -constipation, nausea or vomiting -cough -drowsy -runny or stuffy nose -trouble sleeping This list may not describe all possible side effects. Call your doctor for medical advice about side effects. You may report side effects to FDA at 1-800-FDA-1088. Where should I keep my medicine? Keep out of the reach of children. Store at room temperature between 15 and 30 degrees C (59 and 86 degrees F). Throw away any unused medicine after the expiration date. NOTE: This sheet is a summary. It may not cover all possible information. If you have questions about this medicine, talk to your doctor, pharmacist, or health care provider.  2018 Elsevier/Gold Standard (2012-07-20 14:11:34)  

## 2018-06-27 NOTE — Progress Notes (Signed)
Office: [phone]  /  Fax: [phone]   HPI:   Chief Complaint: OBESITY Justin Bowen BurdockRichard is here to discuss his progress with his obesity treatment plan. He is on the Category 3 plan and is following his eating plan approximately 85-90 % of the time. He states he is walking for 30 minutes 1 time per week. Justin Bowen Bowen did well with weight loss. He reports getting bored with breakfast and wants additional options. He also wants to discuss Thanksgiving eating stategies.  His weight is (!) 343 lb (155.6 kg) today and has had a weight loss of 2 pounds over a period of 3 to 4 weeks since his last visit. He has lost 20 lbs since starting treatment with Justin Bowen Bowen.  Vitamin D Deficiency Justin Bowen BurdockRichard has a diagnosis of vitamin D deficiency. He is currently taking prescription Vit D and denies nausea, vomiting or muscle weakness.  At risk for osteopenia and osteoporosis Justin Bowen BurdockRichard is at higher risk of osteopenia and osteoporosis due to vitamin D deficiency.   Insulin Resistance Justin Bowen BurdockRichard has a diagnosis of insulin resistance based on his elevated fasting insulin level >5. Although Justin Bowen Bowen blood glucose readings are still under good control, insulin resistance puts him at greater risk of metabolic syndrome and diabetes. He is taking metformin currently and continues to work on diet and exercise to decrease risk of diabetes. He denies polyphagia.  ALLERGIES: Allergies  Allergen Reactions  . Sulfa Antibiotics Rash    MEDICATIONS: Current Outpatient Medications on File Prior to Visit  Medication Sig Dispense Refill  . chlorthalidone (HYGROTON) 25 MG tablet Take 0.5 tablets (12.5 mg total) by mouth daily. 30 tablet 1  . CRANBERRY EXTRACT PO Take by mouth.    . Cyanocobalamin (B-12 PO) Take by mouth.    . diclofenac sodium (VOLTAREN) 1 % GEL Apply 2 g topically 4 (four) times daily. 200 g 2  . phenazopyridine (PYRIDIUM) 97 MG tablet Take 97 mg by mouth. Take 2 tablets by mouth twice daily     No current  facility-administered medications on file prior to visit.     PAST MEDICAL HISTORY: Past Medical History:  Diagnosis Date  . BPH (benign prostatic hyperplasia)   . Chicken pox   . Fatigue   . Heartburn   . Knee pain   . Leg edema     PAST SURGICAL HISTORY: Past Surgical History:  Procedure Laterality Date  . URETHRA SURGERY      SOCIAL HISTORY: Social History   Tobacco Use  . Smoking status: Never Smoker  . Smokeless tobacco: Never Used  Substance Use Topics  . Alcohol use: Never    Frequency: Never  . Drug use: Never    FAMILY HISTORY: Family History  Problem Relation Age of Onset  . Stroke Father [phone]  . Cancer Brother        brain cancer   . Cancer Paternal Grandmother [id number]       pancreatic cancer     ROS: Review of Systems  Constitutional: Positive for weight loss.  Gastrointestinal: Negative for nausea and vomiting.  Musculoskeletal:       Negative muscle weakness  Endo/Heme/Allergies:       Negative polyphagia    PHYSICAL EXAM: Blood pressure 109/65, pulse 75, temperature (!) 97.5 F (36.4 C), temperature source Oral, height 6' (1.829 m), weight (!) 343 lb (155.6 kg), SpO2 97 %. Body mass index is 46.52 kg/m. Physical Exam  Constitutional: He is oriented to person, place, and time. He appears well-developed and well-nourished.  Cardiovascular: Normal rate.  Pulmonary/Chest: Effort normal.  Musculoskeletal: Normal range of motion.  Neurological: He is oriented to person, place, and time.  Skin: Skin is warm and dry.  Psychiatric: He has a normal mood and affect. His behavior is normal.  Vitals reviewed.   RECENT LABS AND TESTS: BMET    Component Value Date/Time   NA 143 04/21/2018 0920   K 3.8 04/21/2018 0920   CL 100 04/21/2018 0920   CO2 25 04/21/2018 0920   GLUCOSE 103 (H) 04/21/2018 0920   GLUCOSE 111 (H) 12/21/2017 0915   BUN 16 04/21/2018 0920   CREATININE 1.10 04/21/2018 0920   CALCIUM 8.8 04/21/2018 0920   GFRNONAA 74  04/21/2018 0920   GFRAA 86 04/21/2018 0920   Lab Results  Component Value Date   HGBA1C 4.9 04/21/2018   HGBA1C 5.0 12/21/2017   Lab Results  Component Value Date   INSULIN 34.7 (H) 04/21/2018   CBC    Component Value Date/Time   WBC 6.0 04/21/2018 0920   WBC 7.1 12/21/2017 0915   RBC 4.85 04/21/2018 0920   RBC 5.16 12/21/2017 0915   HGB 14.1 04/21/2018 0920   HCT 41.7 04/21/2018 0920   PLT 276.0 12/21/2017 0915   MCV 86 04/21/2018 0920   MCH 29.1 04/21/2018 0920   MCHC 33.8 04/21/2018 0920   MCHC 35.1 12/21/2017 0915   RDW 13.0 04/21/2018 0920   LYMPHSABS 1.2 04/21/2018 0920   MONOABS 0.5 12/21/2017 0915   EOSABS 0.1 04/21/2018 0920   BASOSABS 0.0 04/21/2018 0920   Iron/TIBC/Ferritin/ %Sat No results found for: IRON, TIBC, FERRITIN, IRONPCTSAT Lipid Panel     Component Value Date/Time   CHOL 159 04/21/2018 0920   TRIG 146 04/21/2018 0920   HDL 34 (L) 04/21/2018 0920   CHOLHDL 4 12/21/2017 0915   VLDL 32.2 12/21/2017 0915   LDLCALC 96 04/21/2018 0920   Hepatic Function Panel     Component Value Date/Time   PROT 6.9 04/21/2018 0920   ALBUMIN 4.2 04/21/2018 0920   AST 13 04/21/2018 0920   ALT 14 04/21/2018 0920   ALKPHOS 101 04/21/2018 0920   BILITOT 0.5 04/21/2018 0920   BILIDIR 0.1 12/21/2017 0915      Component Value Date/Time   TSH 2.180 04/21/2018 0920   TSH 2.65 12/21/2017 0915   Results for Outten, Justin Bowen Bowen (MRN [id number]) as of 06/27/2018 12:33  Ref. Range 04/21/2018 09:20  Vitamin D, 25-Hydroxy Latest Ref Range: 30.0 - 100.0 ng/mL 24.5 (L)   ASSESSMENT AND PLAN: Vitamin D deficiency - Plan: Vitamin D, Ergocalciferol, (DRISDOL) 1.25 MG (50000 UT) CAPS capsule  Insulin resistance - Plan: metFORMIN (GLUCOPHAGE) 500 MG tablet  At risk for osteoporosis  Class 3 severe obesity with serious comorbidity and body mass index (BMI) of 45.0 to 49.9 in adult, unspecified obesity type (HCC)  PLAN:  Vitamin D Deficiency Justin Bowen was informed that low  vitamin D levels contributes to fatigue and are associated with obesity, breast, and colon cancer. Justin Bowen agrees to continue taking prescription Vit D @50 ,000 IU every week #4 and we will refill for 1 month. He will follow up for routine testing of vitamin D, at least 2-3 times per year. He was informed of the risk of over-replacement of vitamin D and agrees to not increase his dose unless he discusses this with [place] first. Omario agrees to follow up with our clinic in 3 weeks.  At risk for osteopenia and osteoporosis Skippy was given extended (15 minutes) osteoporosis prevention counseling  today. Irby is at risk for osteopenia and osteoporsis due to his vitamin D deficiency. He was encouraged to take his vitamin D and follow his higher calcium diet and increase strengthening exercise to help strengthen his bones and decrease his risk of osteopenia and osteoporosis.  Insulin Resistance Arliss will continue to work on weight loss, exercise, and decreasing simple carbohydrates in his diet to help decrease the risk of diabetes. We dicussed metformin including benefits and risks. He was informed that eating too many simple carbohydrates or too many calories at one sitting increases the likelihood of GI side effects. Ollis agrees to continue taking metformin 500 mg q AM #30 and we will refill for 1 month. Javaris agrees to follow up with our clinic in 3 weeks as directed to monitor his progress.  Obesity Weslee is currently in the action stage of change. As such, his goal is to continue with weight loss efforts He has agreed to follow the Category 3 plan Raiden has been instructed to work up to a goal of 150 minutes of combined cardio and strengthening exercise per week for weight loss and overall health benefits. We discussed the following Behavioral Modification Strategies today: work on meal planning and easy cooking plans and holiday eating strategies    Kelechi has agreed to follow up with  our clinic in 3 weeks. He was informed of the importance of frequent follow up visits to maximize his success with intensive lifestyle modifications for his multiple health conditions.   OBESITY BEHAVIORAL INTERVENTION VISIT  Today's visit was # 5   Starting weight: 363 lbs Starting date: 04/21/18 Today's weight : 343 lbs Today's date: 06/22/2018 Total lbs lost to date: 20    ASK: We discussed the diagnosis of obesity with Roxanne Gates today and Justin Bowen Bowen agreed to give [place] permission to discuss obesity behavioral modification therapy today.  ASSESS: Andrey has the diagnosis of obesity and his BMI today is 46.51 Randale is in the action stage of change   ADVISE: Jawad was educated on the multiple health risks of obesity as well as the benefit of weight loss to improve his health. He was advised of the need for long term treatment and the importance of lifestyle modifications.  AGREE: Multiple dietary modification options and treatment options were discussed and  Hillard agreed to the above obesity treatment plan.  Trude Mcburney, am acting as transcriptionist for Alois Cliche, PA-C I, Alois Cliche, PA-C have reviewed above note and agree with its content

## 2018-07-13 ENCOUNTER — Encounter (INDEPENDENT_AMBULATORY_CARE_PROVIDER_SITE_OTHER): Payer: Self-pay | Admitting: Physician Assistant

## 2018-07-13 ENCOUNTER — Ambulatory Visit (INDEPENDENT_AMBULATORY_CARE_PROVIDER_SITE_OTHER): Payer: No Typology Code available for payment source | Admitting: Physician Assistant

## 2018-07-13 VITALS — BP 126/75 | HR 68 | Temp 97.6°F | Ht 72.0 in | Wt 334.0 lb

## 2018-07-13 DIAGNOSIS — Z6841 Body Mass Index (BMI) 40.0 and over, adult: Secondary | ICD-10-CM | POA: Diagnosis not present

## 2018-07-13 DIAGNOSIS — E88819 Insulin resistance, unspecified: Secondary | ICD-10-CM

## 2018-07-13 DIAGNOSIS — E559 Vitamin D deficiency, unspecified: Secondary | ICD-10-CM | POA: Diagnosis not present

## 2018-07-13 DIAGNOSIS — E8881 Metabolic syndrome: Secondary | ICD-10-CM | POA: Diagnosis not present

## 2018-07-13 DIAGNOSIS — Z9189 Other specified personal risk factors, not elsewhere classified: Secondary | ICD-10-CM

## 2018-07-13 MED ORDER — VITAMIN D (ERGOCALCIFEROL) 1.25 MG (50000 UNIT) PO CAPS: 50000.0000 [IU] | ORAL_CAPSULE | ORAL | 0 refills | Status: DC

## 2018-07-13 MED FILL — VIT D2 1.25 MG (50,000 UNIT: 1.25 MG | 28 days supply | Qty: 4 | Fill #0

## 2018-07-13 NOTE — Progress Notes (Signed)
Office: [phone]  /  Fax: [phone]   HPI:   Chief Complaint: OBESITY Justin BurdockRichard is here to discuss his progress with his obesity treatment plan. He is on the Category 3 plan and is following his eating plan approximately 80 % of the time. He states he is riding the bike for 15-20 minutes 1 time per week. Justin Bowen did very well with weight loss. He has been sticking close with the plan. He was able to portion control during Thanksgiving. He reports that he will be traveling in the month of January for work.  His weight is (!) 334 lb (151.5 kg) today and has had a weight loss of 9 pounds over a period of 3 weeks since his last visit. He has lost 29 lbs since starting treatment with Justin Bowen.  Vitamin D Deficiency Justin BurdockRichard has a diagnosis of vitamin D deficiency. He is currently taking prescription Vit D and denies nausea, vomiting or muscle weakness.  At risk for osteopenia and osteoporosis Justin BurdockRichard is at higher risk of osteopenia and osteoporosis due to vitamin D deficiency.   Insulin Resistance Justin BurdockRichard has a diagnosis of insulin resistance based on his elevated fasting insulin level >5. Although Justin Bowen's blood glucose readings are still under good control, insulin resistance puts him at greater risk of metabolic syndrome and diabetes. He is on metformin and denies nausea, vomiting, or diarrhea. He continues to work on diet and exercise to decrease risk of diabetes. He denies polyphagia.  ALLERGIES: Allergies  Allergen Reactions  . Sulfa Antibiotics Rash    MEDICATIONS: Current Outpatient Medications on File Prior to Visit  Medication Sig Dispense Refill  . chlorthalidone (HYGROTON) 25 MG tablet Take 0.5 tablets (12.5 mg total) by mouth daily. 30 tablet 1  . CRANBERRY EXTRACT PO Take by mouth.    . Cyanocobalamin (B-12 PO) Take by mouth.    . diclofenac sodium (VOLTAREN) 1 % GEL Apply 2 g topically 4 (four) times daily. 200 g 2  . metFORMIN (GLUCOPHAGE) 500 MG tablet Take 1 tablet  (500 mg total) by mouth daily with breakfast. 30 tablet 0  . phenazopyridine (PYRIDIUM) 97 MG tablet Take 97 mg by mouth. Take 2 tablets by mouth twice daily    . tamsulosin (FLOMAX) 0.4 MG CAPS capsule Take 1 capsule (0.4 mg total) by mouth daily. 30 capsule 3   No current facility-administered medications on file prior to visit.     PAST MEDICAL HISTORY: Past Medical History:  Diagnosis Date  . BPH (benign prostatic hyperplasia)   . Chicken pox   . Fatigue   . Heartburn   . Knee pain   . Leg edema     PAST SURGICAL HISTORY: Past Surgical History:  Procedure Laterality Date  . URETHRA SURGERY      SOCIAL HISTORY: Social History   Tobacco Use  . Smoking status: Never Smoker  . Smokeless tobacco: Never Used  Substance Use Topics  . Alcohol use: Never    Frequency: Never  . Drug use: Never    FAMILY HISTORY: Family History  Problem Relation Age of Onset  . Stroke Father [phone]  . Cancer Brother        brain cancer   . Cancer Paternal Grandmother [id number]       pancreatic cancer     ROS: Review of Systems  Constitutional: Positive for weight loss.  Gastrointestinal: Negative for diarrhea, nausea and vomiting.  Musculoskeletal:       Negative muscle weakness  Endo/Heme/Allergies:  Negative polyphagia    PHYSICAL EXAM: Blood pressure 126/75, pulse 68, temperature 97.6 F (36.4 C), temperature source Oral, height 6' (1.829 m), weight (!) 334 lb (151.5 kg), SpO2 94 %. Body mass index is 45.3 kg/m. Physical Exam  Constitutional: He is oriented to person, place, and time. He appears well-developed and well-nourished.  Cardiovascular: Normal rate.  Pulmonary/Chest: Effort normal.  Musculoskeletal: Normal range of motion.  Neurological: He is oriented to person, place, and time.  Skin: Skin is warm and dry.  Psychiatric: He has a normal mood and affect. His behavior is normal.  Vitals reviewed.   RECENT LABS AND TESTS: BMET    Component Value Date/Time   NA  143 04/21/2018 0920   K 3.8 04/21/2018 0920   CL 100 04/21/2018 0920   CO2 25 04/21/2018 0920   GLUCOSE 103 (H) 04/21/2018 0920   GLUCOSE 111 (H) 12/21/2017 0915   BUN 16 04/21/2018 0920   CREATININE 1.10 04/21/2018 0920   CALCIUM 8.8 04/21/2018 0920   GFRNONAA 74 04/21/2018 0920   GFRAA 86 04/21/2018 0920   Lab Results  Component Value Date   HGBA1C 4.9 04/21/2018   HGBA1C 5.0 12/21/2017   Lab Results  Component Value Date   INSULIN 34.7 (H) 04/21/2018   CBC    Component Value Date/Time   WBC 6.0 04/21/2018 0920   WBC 7.1 12/21/2017 0915   RBC 4.85 04/21/2018 0920   RBC 5.16 12/21/2017 0915   HGB 14.1 04/21/2018 0920   HCT 41.7 04/21/2018 0920   PLT 276.0 12/21/2017 0915   MCV 86 04/21/2018 0920   MCH 29.1 04/21/2018 0920   MCHC 33.8 04/21/2018 0920   MCHC 35.1 12/21/2017 0915   RDW 13.0 04/21/2018 0920   LYMPHSABS 1.2 04/21/2018 0920   MONOABS 0.5 12/21/2017 0915   EOSABS 0.1 04/21/2018 0920   BASOSABS 0.0 04/21/2018 0920   Iron/TIBC/Ferritin/ %Sat No results found for: IRON, TIBC, FERRITIN, IRONPCTSAT Lipid Panel     Component Value Date/Time   CHOL 159 04/21/2018 0920   TRIG 146 04/21/2018 0920   HDL 34 (L) 04/21/2018 0920   CHOLHDL 4 12/21/2017 0915   VLDL 32.2 12/21/2017 0915   LDLCALC 96 04/21/2018 0920   Hepatic Function Panel     Component Value Date/Time   PROT 6.9 04/21/2018 0920   ALBUMIN 4.2 04/21/2018 0920   AST 13 04/21/2018 0920   ALT 14 04/21/2018 0920   ALKPHOS 101 04/21/2018 0920   BILITOT 0.5 04/21/2018 0920   BILIDIR 0.1 12/21/2017 0915      Component Value Date/Time   TSH 2.180 04/21/2018 0920   TSH 2.65 12/21/2017 0915  Results for Justin Bowen, Justin Bowen (MRN [id number]) as of 07/13/2018 13:38  Ref. Range 04/21/2018 09:20  Vitamin D, 25-Hydroxy Latest Ref Range: 30.0 - 100.0 ng/mL 24.5 (L)    ASSESSMENT AND PLAN: Vitamin D deficiency - Plan: Vitamin D, Ergocalciferol, (DRISDOL) 1.25 MG (50000 UT) CAPS capsule  Insulin  resistance  At risk for osteoporosis  Class 3 severe obesity with serious comorbidity and body mass index (BMI) of 45.0 to 49.9 in adult, unspecified obesity type (HCC)  PLAN:  Vitamin D Deficiency Justin Bowen was informed that low vitamin D levels contributes to fatigue and are associated with obesity, breast, and colon cancer. Justin Bowen agrees to continue taking prescription Vit D @50 ,000 IU every week #4 and we will refill for 1 month. He will follow up for routine testing of vitamin D, at least 2-3 times per year. He was  informed of the risk of over-replacement of vitamin D and agrees to not increase his dose unless he discusses this with [place] first. Justin Bowen agrees to follow up with our clinic in 3 to 4 weeks.  At risk for osteopenia and osteoporosis Justin Bowen was given extended (15 minutes) osteoporosis prevention counseling today. Justin Bowen is at risk for osteopenia and osteoporsis due to his vitamin D deficiency. He was encouraged to take his vitamin D and follow his higher calcium diet and increase strengthening exercise to help strengthen his bones and decrease his risk of osteopenia and osteoporosis.  Insulin Resistance Justin Bowen will continue to work on weight loss, diet, exercise, and decreasing simple carbohydrates in his diet to help decrease the risk of diabetes. We dicussed metformin including benefits and risks. He was informed that eating too many simple carbohydrates or too many calories at one sitting increases the likelihood of GI side effects. Justin Bowen agrees to continue taking metformin and he agrees to follow up with our clinic in 3 to 4 weeks as directed to monitor his progress.  Obesity Justin Bowen is currently in the action stage of change. As such, his goal is to continue with weight loss efforts He has agreed to follow the Category 3 plan Justin Bowen has been instructed to work up to a goal of 150 minutes of combined cardio and strengthening exercise per week for weight loss and overall  health benefits. We discussed the following Behavioral Modification Strategies today: work on meal planning and easy cooking plans and holiday eating strategies    Justin Bowen has agreed to follow up with our clinic in 3 to 4 weeks. He was informed of the importance of frequent follow up visits to maximize his success with intensive lifestyle modifications for his multiple health conditions.   OBESITY BEHAVIORAL INTERVENTION VISIT  Today's visit was # 6   Starting weight: 363 lbs Starting date: 04/21/18 Today's weight : 334 lbs Today's date: 07/13/2018 Total lbs lost to date: 28    ASK: We discussed the diagnosis of obesity with Justin Bowen today and Justin Bowen agreed to give [place] permission to discuss obesity behavioral modification therapy today.  ASSESS: Justin Bowen has the diagnosis of obesity and his BMI today is 45.29 Justin Bowen is in the action stage of change   ADVISE: Justin Bowen was educated on the multiple health risks of obesity as well as the benefit of weight loss to improve his health. He was advised of the need for long term treatment and the importance of lifestyle modifications.  AGREE: Multiple dietary modification options and treatment options were discussed and  Justin Bowen agreed to the above obesity treatment plan.  Trude Mcburney, am acting as transcriptionist for Alois Cliche, PA-C I, Alois Cliche, PA-C have reviewed above note and agree with its content

## 2018-07-27 MED FILL — TAMSULOSIN HCL 0.4 MG CAP: 0.4 | 30 days supply | Qty: 30 | Fill #1

## 2018-07-28 ENCOUNTER — Encounter (INDEPENDENT_AMBULATORY_CARE_PROVIDER_SITE_OTHER): Payer: Self-pay | Admitting: Physician Assistant

## 2018-07-29 ENCOUNTER — Other Ambulatory Visit (INDEPENDENT_AMBULATORY_CARE_PROVIDER_SITE_OTHER): Payer: Self-pay | Admitting: Physician Assistant

## 2018-07-29 DIAGNOSIS — E8881 Metabolic syndrome: Secondary | ICD-10-CM

## 2018-08-01 MED FILL — DICLOFENAC SODIUM 1 % GEL: 1 | 25 days supply | Qty: 200 | Fill #1

## 2018-08-01 MED FILL — metFORMIN HCL 500 MG TABS: 500 | 22 days supply | Qty: 22 | Fill #0

## 2018-08-08 ENCOUNTER — Ambulatory Visit: Payer: No Typology Code available for payment source | Admitting: Family Medicine

## 2018-08-09 ENCOUNTER — Encounter: Payer: Self-pay | Admitting: Family Medicine

## 2018-08-09 ENCOUNTER — Ambulatory Visit (INDEPENDENT_AMBULATORY_CARE_PROVIDER_SITE_OTHER): Payer: No Typology Code available for payment source | Admitting: Family Medicine

## 2018-08-09 VITALS — BP 128/78 | HR 67 | Temp 97.4°F | Ht 72.0 in | Wt 338.4 lb

## 2018-08-09 DIAGNOSIS — N401 Enlarged prostate with lower urinary tract symptoms: Secondary | ICD-10-CM

## 2018-08-09 DIAGNOSIS — R609 Edema, unspecified: Secondary | ICD-10-CM

## 2018-08-09 DIAGNOSIS — N529 Male erectile dysfunction, unspecified: Secondary | ICD-10-CM | POA: Diagnosis not present

## 2018-08-09 DIAGNOSIS — R030 Elevated blood-pressure reading, without diagnosis of hypertension: Secondary | ICD-10-CM | POA: Diagnosis not present

## 2018-08-09 DIAGNOSIS — R3912 Poor urinary stream: Secondary | ICD-10-CM

## 2018-08-09 LAB — BASIC METABOLIC PANEL
BUN: 29 mg/dL — ABNORMAL HIGH (ref 6–23)
CHLORIDE: 103 meq/L (ref 96–112)
CO2: 27 mEq/L (ref 19–32)
Calcium: 9.9 mg/dL (ref 8.4–10.5)
Creatinine, Ser: 1.21 mg/dL (ref 0.40–1.50)
GFR: 65.4 mL/min (ref >60.00)
Glucose, Bld: 106 mg/dL — ABNORMAL HIGH (ref 70–99)
POTASSIUM: 3.8 meq/L (ref 3.5–5.1)
SODIUM: 138 meq/L (ref 135–145)

## 2018-08-09 MED ORDER — CHLORTHALIDONE 25 MG PO TABS: 12.5000 mg | ORAL_TABLET | Freq: Every day | ORAL | 1 refills | Status: DC

## 2018-08-09 MED ORDER — TAMSULOSIN HCL 0.4 MG PO CAPS: 0.4000 mg | ORAL_CAPSULE | Freq: Every day | ORAL | 1 refills | Status: DC

## 2018-08-09 MED ORDER — SILDENAFIL CITRATE 20 MG PO TABS: ORAL_TABLET | ORAL | 1 refills | Status: DC

## 2018-08-09 MED FILL — CHLORTHALIDONE 25 MG TABS: 25 | 90 days supply | Qty: 45 | Fill #0

## 2018-08-09 MED FILL — SILDENAFIL CITRATE 20 MG TA: 20 | 10 days supply | Qty: 50 | Fill #0

## 2018-08-09 NOTE — Progress Notes (Signed)
Established Patient Office Visit  Subjective:  Patient ID: Justin Bowen, male    DOB: Jul 14, 1961  Age: 58 y.o. MRN: [id number]  CC:  Chief Complaint  Patient presents with  . Follow-up    HPI Justin GatesRichard Bowen presents for follow-up of his blood pressure BPH symptoms status post starting Flomax.  Flomax is worked well for him his urine flow is much improved.  Nocturia has decreased.  There is been no rash or other reaction to the Flomax.  Edema is well controlled with a 12.5 mg of chlorthalidone.  He brings in multiple blood pressure readings in the mostly 120s/ >65 range.  Continues to lose weight with the weight loss clinic.  At this point in time they are monitoring his vitamin D levels.  He is tolerating the metformin.  Past Medical History:  Diagnosis Date  . BPH (benign prostatic hyperplasia)   . Chicken pox   . Fatigue   . Heartburn   . Knee pain   . Leg edema     Past Surgical History:  Procedure Laterality Date  . URETHRA SURGERY      Family History  Problem Relation Age of Onset  . Stroke Father [id number]  . Cancer Brother        brain cancer   . Cancer Paternal Grandmother [id number]       pancreatic cancer     Social History   Socioeconomic History  . Marital status: Married    Spouse name: Rona RavensValerie Bowen  . Number of children: Not on file  . Years of education: Not on file  . Highest education level: Not on file  Occupational History  . Not on file  Social Needs  . Financial resource strain: Not on file  . Food insecurity:    Worry: Not on file    Inability: Not on file  . Transportation needs:    Medical: Not on file    Non-medical: Not on file  Tobacco Use  . Smoking status: Never Smoker  . Smokeless tobacco: Never Used  Substance and Sexual Activity  . Alcohol use: Never    Frequency: Never  . Drug use: Never  . Sexual activity: Not on file  Lifestyle  . Physical activity:    Days per week: Not on file    Minutes per session: Not on file  . Stress: Not  on file  Relationships  . Social connections:    Talks on phone: Not on file    Gets together: Not on file    Attends religious service: Not on file    Active member of club or organization: Not on file    Attends meetings of clubs or organizations: Not on file    Relationship status: Not on file  . Intimate partner violence:    Fear of current or ex partner: Not on file    Emotionally abused: Not on file    Physically abused: Not on file    Forced sexual activity: Not on file  Other Topics Concern  . 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 CRANBERRY EXTRACT PO Take by mouth.    . Cyanocobalamin (B-12 PO) Take by mouth.    . diclofenac sodium (VOLTAREN) 1 % GEL Apply 2 g topically 4 (four) times daily. 200 g 2  . metFORMIN (GLUCOPHAGE) 500  MG tablet TAKE 1 TABLET BY MOUTH ONCE DAILY WITH BREAKFAST 22 tablet 0  . phenazopyridine (PYRIDIUM) 97 MG tablet Take 2 tablets by mouth in the morning.    . Vitamin D, Ergocalciferol, (DRISDOL) 1.25 MG (50000 UT) CAPS capsule Take 1 capsule (50,000 Units total) by mouth every 7 (seven) days. 4 capsule 0  . chlorthalidone (HYGROTON) 25 MG tablet Take 0.5 tablets (12.5 mg total) by mouth daily. 30 tablet 1  . phenazopyridine (PYRIDIUM) 97 MG tablet Take 97 mg by mouth. Take 2 tablets by mouth twice daily    . tamsulosin (FLOMAX) 0.4 MG CAPS capsule Take 1 capsule (0.4 mg total) by mouth daily. 30 capsule 3   No facility-administered medications prior to visit.     Allergies  Allergen Reactions  . Sulfa Antibiotics Rash    ROS Review of Systems  Constitutional: Negative.   Respiratory: Negative.   Cardiovascular: Negative.   Gastrointestinal: Negative.   Genitourinary: Negative.   Hematological: Does not bruise/bleed easily.  Psychiatric/Behavioral: Negative.       Objective:    Physical Exam    Constitutional: He is oriented to person, place, and time. He appears well-developed and well-nourished. No distress.  HENT:  Head: Normocephalic and atraumatic.  Right Ear: External ear normal.  Left Ear: External ear normal.  Eyes: Right eye exhibits no discharge. Left eye exhibits no discharge. No scleral icterus.  Neck: No JVD present. No tracheal deviation present.  Pulmonary/Chest: Effort normal. No stridor.  Neurological: He is alert and oriented to person, place, and time.  Skin: Skin is warm and dry. He is not diaphoretic.  Psychiatric: He has a normal mood and affect. His behavior is normal.    BP 128/78   Pulse 67   Temp (!) 97.4 F (36.3 C) (Oral)   Ht 6' (1.829 m)   Wt (!) 338 lb 6 oz (153.5 kg)   SpO2 94%   BMI 45.89 kg/m  Wt Readings from Last 3 Encounters:  08/09/18 (!) 338 lb 6 oz (153.5 kg)  07/13/18 (!) 334 lb (151.5 kg)  06/23/18 (!) 343 lb (155.6 kg)   BP Readings from Last 3 Encounters:  08/09/18 128/78  07/13/18 126/75  06/23/18 110/70   Guideline developer:  UpToDate (see UpToDate for funding source) Date Released: June 2014  There are no preventive care reminders to display for this patient.  There are no preventive care reminders to display for this patient.  Lab Results  Component Value Date   TSH 2.180 04/21/2018   Lab Results  Component Value Date   WBC 6.0 04/21/2018   HGB 14.1 04/21/2018   HCT 41.7 04/21/2018   MCV 86 04/21/2018   PLT 276.0 12/21/2017   Lab Results  Component Value Date   NA 143 04/21/2018   K 3.8 04/21/2018   CO2 25 04/21/2018   GLUCOSE 103 (H) 04/21/2018   BUN 16 04/21/2018   CREATININE 1.10 04/21/2018   BILITOT 0.5 04/21/2018   ALKPHOS 101 04/21/2018   AST 13 04/21/2018   ALT 14 04/21/2018   PROT 6.9 04/21/2018   ALBUMIN 4.2 04/21/2018   CALCIUM 8.8 04/21/2018   GFR 80.74 12/21/2017   Lab Results  Component Value Date   CHOL 159 04/21/2018   Lab Results  Component Value Date   HDL 34 (L)  04/21/2018   Lab Results  Component Value Date   LDLCALC 96 04/21/2018   Lab Results  Component Value Date   TRIG 146 04/21/2018  Lab Results  Component Value Date   CHOLHDL 4 12/21/2017   Lab Results  Component Value Date   HGBA1C 4.9 04/21/2018      Assessment & Plan:   Problem List Items Addressed This Visit      Other   Benign prostatic hyperplasia with weak urinary stream - Primary   Relevant Medications   tamsulosin (FLOMAX) 0.4 MG CAPS capsule   Edema   Relevant Medications   chlorthalidone (HYGROTON) 25 MG tablet   Other Relevant Orders   Basic metabolic panel   Elevated BP without diagnosis of hypertension   Relevant Medications   chlorthalidone (HYGROTON) 25 MG tablet   Other Relevant Orders   Basic metabolic panel   Erectile dysfunction      Meds ordered this encounter  Medications  . chlorthalidone (HYGROTON) 25 MG tablet    Sig: Take 0.5 tablets (12.5 mg total) by mouth daily.    Dispense:  90 tablet    Refill:  1  . tamsulosin (FLOMAX) 0.4 MG CAPS capsule    Sig: Take 1 capsule (0.4 mg total) by mouth daily.    Dispense:  90 capsule    Refill:  1  . sildenafil (REVATIO) 20 MG tablet    Sig: Take 3-5 tablets 45 minutes prior daily as needed.    Dispense:  50 tablet    Refill:  1    Follow-up: Return in about 6 months (around 02/07/2019).    Patient will follow-up in 6 months and I will assume care for his vitamin D deficiency and prediabetes.

## 2018-08-12 ENCOUNTER — Encounter: Payer: Self-pay | Admitting: Family Medicine

## 2018-08-12 DIAGNOSIS — R609 Edema, unspecified: Secondary | ICD-10-CM

## 2018-08-12 DIAGNOSIS — E559 Vitamin D deficiency, unspecified: Secondary | ICD-10-CM

## 2018-08-12 DIAGNOSIS — R030 Elevated blood-pressure reading, without diagnosis of hypertension: Secondary | ICD-10-CM

## 2018-08-12 MED ORDER — CHLORTHALIDONE 25 MG PO TABS: 12.5000 mg | ORAL_TABLET | Freq: Every day | ORAL | 3 refills | Status: DC

## 2018-08-17 ENCOUNTER — Encounter (INDEPENDENT_AMBULATORY_CARE_PROVIDER_SITE_OTHER): Payer: Self-pay | Admitting: Physician Assistant

## 2018-08-23 ENCOUNTER — Encounter (INDEPENDENT_AMBULATORY_CARE_PROVIDER_SITE_OTHER): Payer: Self-pay | Admitting: Physician Assistant

## 2018-08-23 ENCOUNTER — Ambulatory Visit (INDEPENDENT_AMBULATORY_CARE_PROVIDER_SITE_OTHER): Payer: No Typology Code available for payment source | Admitting: Physician Assistant

## 2018-08-23 VITALS — BP 110/70 | HR 68 | Temp 98.1°F | Ht 72.0 in | Wt 331.0 lb

## 2018-08-23 DIAGNOSIS — Z6841 Body Mass Index (BMI) 40.0 and over, adult: Secondary | ICD-10-CM

## 2018-08-23 DIAGNOSIS — Z9189 Other specified personal risk factors, not elsewhere classified: Secondary | ICD-10-CM

## 2018-08-23 DIAGNOSIS — E559 Vitamin D deficiency, unspecified: Secondary | ICD-10-CM | POA: Diagnosis not present

## 2018-08-23 DIAGNOSIS — E8881 Metabolic syndrome: Secondary | ICD-10-CM

## 2018-08-23 MED ORDER — VITAMIN D (ERGOCALCIFEROL) 1.25 MG (50000 UNIT) PO CAPS: 50000.0000 [IU] | ORAL_CAPSULE | ORAL | 0 refills | Status: DC

## 2018-08-23 MED ORDER — METFORMIN HCL 500 MG PO TABS: ORAL_TABLET | ORAL | 0 refills | Status: DC

## 2018-08-23 MED FILL — metFORMIN HCL 500 MG TABS: 500 | 30 days supply | Qty: 30 | Fill #0

## 2018-08-23 MED FILL — VIT D2 1.25 MG (50,000 UNIT: 1.25 MG | 28 days supply | Qty: 4 | Fill #0

## 2018-08-23 NOTE — Progress Notes (Signed)
Office: [phone]  /  Fax: [phone]   HPI:   Chief Complaint: OBESITY Justin BurdockRichard is here to discuss his progress with his obesity treatment plan. He is on the Category 3 plan and is following his eating plan approximately 80 % of the time. He states he is exercising 0 minutes 0 times per week. Justin Bowen did well with weight loss. He reports following the plan closely. He is not getting his snack calories in everyday.   His weight is (!) 331 lb (150.1 kg) today and has had a weight loss of 3 pounds over a period of 6 weeks since his last visit. He has lost 32 lbs since starting treatment with Justin Bowen.  Insulin Resistance Justin BurdockRichard has a diagnosis of insulin resistance based on his elevated fasting insulin level >5. Although Justin Bowen's blood glucose readings are still under good control, insulin resistance puts him at greater risk of metabolic syndrome and diabetes. He is taking metformin currently and continues to work on diet and exercise to decrease risk of diabetes.  Vitamin D deficiency Justin BurdockRichard has a diagnosis of vitamin D deficiency. He is currently taking prescription Vit D and denies nausea, vomiting or muscle weakness.  At risk for diabetes Justin BurdockRichard is at higher than average risk for developing diabetes due to his obesity. He currently denies polyuria or polydipsia.   ASSESSMENT AND PLAN:  Insulin resistance - Plan: metFORMIN (GLUCOPHAGE) 500 MG tablet  Vitamin D deficiency - Plan: Vitamin D, Ergocalciferol, (DRISDOL) 1.25 MG (50000 UT) CAPS capsule  At risk for diabetes mellitus  Class 3 severe obesity with serious comorbidity and body mass index (BMI) of 40.0 to 44.9 in adult, unspecified obesity type (HCC)  PLAN:  Insulin Resistance Justin Bowen will continue to work on weight loss, exercise, and decreasing simple carbohydrates in his diet to help decrease the risk of diabetes. We dicussed metformin including benefits and risks. He was informed that eating too many simple  carbohydrates or too many calories at one sitting increases the likelihood of GI side effects. Justin Bowen agrees to continue taking metformin 500 mg qd #30 with no refills for now and prescription was written today. Justin Bowen agrees to follow up with our clinic in 4-5 weeks.  Vitamin D Deficiency Justin Bowen was informed that low vitamin D levels contributes to fatigue and are associated with obesity, breast, and colon cancer. He agrees to continue to take prescription Vit D @50 ,000 IU every week # 4 with no refills and will follow up for routine testing of vitamin D, at least 2-3 times per year. He was informed of the risk of over-replacement of vitamin D and agrees to not increase his dose unless he discusses this with Justin Bowen first.  Justin BurdockRichard agrees to follow up with our clinic in 4-5 weeks  Diabetes risk counseling Justin BurdockRichard was given extended (15 minutes) diabetes prevention counseling today. He is 58 y.o. male and has risk factors for diabetes including obesity. We discussed intensive lifestyle modifications today with an emphasis on weight loss as well as increasing exercise and decreasing simple carbohydrates in his diet.  Obesity Justin BurdockRichard is currently in the action stage of change. As such, his goal is to continue with weight loss efforts He has agreed to follow the Category 3 plan Justin BurdockRichard has been instructed to work up to a goal of 150 minutes of combined cardio and strengthening exercise per week for weight loss and overall health benefits. We discussed the following Behavioral Modification Strategies today: work on meal planning and easy cooking plans and  planning for success  Justin Bowen has agreed to follow up with our clinic in 4-5 weeks. He was informed of the importance of frequent follow up visits to maximize his success with intensive lifestyle modifications for his multiple health conditions.  ALLERGIES: Allergies  Allergen Reactions  . Sulfa Antibiotics Rash    MEDICATIONS: Current Outpatient  Medications on File Prior to Visit  Medication Sig Dispense Refill  . chlorthalidone (HYGROTON) 25 MG tablet Take 0.5 tablets (12.5 mg total) by mouth daily. 90 tablet 3  . CRANBERRY EXTRACT PO Take by mouth.    . Cyanocobalamin (B-12 PO) Take by mouth.    . diclofenac sodium (VOLTAREN) 1 % GEL Apply 2 g topically 4 (four) times daily. 200 g 2  . phenazopyridine (PYRIDIUM) 97 MG tablet Take 2 tablets by mouth in the morning.    . sildenafil (REVATIO) 20 MG tablet Take 3-5 tablets 45 minutes prior daily as needed. 50 tablet 1  . tamsulosin (FLOMAX) 0.4 MG CAPS capsule Take 1 capsule (0.4 mg total) by mouth daily. 90 capsule 1   No current facility-administered medications on file prior to visit.     PAST MEDICAL HISTORY: Past Medical History:  Diagnosis Date  . BPH (benign prostatic hyperplasia)   . Chicken pox   . Fatigue   . Heartburn   . Knee pain   . Leg edema     PAST SURGICAL HISTORY: Past Surgical History:  Procedure Laterality Date  . URETHRA SURGERY      SOCIAL HISTORY: Social History   Tobacco Use  . Smoking status: Never Smoker  . Smokeless tobacco: Never Used  Substance Use Topics  . Alcohol use: Never    Frequency: Never  . Drug use: Never    FAMILY HISTORY: Family History  Problem Relation Age of Onset  . Stroke Father 72  . Cancer Brother        brain cancer   . Cancer Paternal Grandmother 26       pancreatic cancer     ROS: Review of Systems  Constitutional: Positive for weight loss.  Gastrointestinal: Negative for diarrhea, nausea and vomiting.  Genitourinary: Negative for frequency.  Musculoskeletal:       Negative for muscle weakness  Endo/Heme/Allergies: Negative for polydipsia.       Negative for polyphagia    PHYSICAL EXAM: Blood pressure 110/70, pulse 68, temperature 98.1 F (36.7 C), temperature source Oral, height 6' (1.829 m), weight (!) 331 lb (150.1 kg), SpO2 96 %. Body mass index is 44.89 kg/m. Physical Exam Vitals signs  reviewed.  Constitutional:      Appearance: Normal appearance. He is obese.  Cardiovascular:     Rate and Rhythm: Normal rate.  Pulmonary:     Effort: Pulmonary effort is normal.  Musculoskeletal: Normal range of motion.  Skin:    General: Skin is warm and dry.  Neurological:     Mental Status: He is alert and oriented to person, place, and time.  Psychiatric:        Mood and Affect: Mood normal.     RECENT LABS AND TESTS: BMET    Component Value Date/Time   NA 138 08/09/2018 1112   NA 143 04/21/2018 0920   K 3.8 08/09/2018 1112   CL 103 08/09/2018 1112   CO2 27 08/09/2018 1112   GLUCOSE 106 (H) 08/09/2018 1112   BUN 29 (H) 08/09/2018 1112   BUN 16 04/21/2018 0920   CREATININE 1.21 08/09/2018 1112   CALCIUM 9.9 08/09/2018  1112   GFRNONAA 74 04/21/2018 0920   GFRAA 86 04/21/2018 0920   Lab Results  Component Value Date   HGBA1C 4.9 04/21/2018   HGBA1C 5.0 12/21/2017   Lab Results  Component Value Date   INSULIN 34.7 (H) 04/21/2018   CBC    Component Value Date/Time   WBC 6.0 04/21/2018 0920   WBC 7.1 12/21/2017 0915   RBC 4.85 04/21/2018 0920   RBC 5.16 12/21/2017 0915   HGB 14.1 04/21/2018 0920   HCT 41.7 04/21/2018 0920   PLT 276.0 12/21/2017 0915   MCV 86 04/21/2018 0920   MCH 29.1 04/21/2018 0920   MCHC 33.8 04/21/2018 0920   MCHC 35.1 12/21/2017 0915   RDW 13.0 04/21/2018 0920   LYMPHSABS 1.2 04/21/2018 0920   MONOABS 0.5 12/21/2017 0915   EOSABS 0.1 04/21/2018 0920   BASOSABS 0.0 04/21/2018 0920   Iron/TIBC/Ferritin/ %Sat No results found for: IRON, TIBC, FERRITIN, IRONPCTSAT Lipid Panel     Component Value Date/Time   CHOL 159 04/21/2018 0920   TRIG 146 04/21/2018 0920   HDL 34 (L) 04/21/2018 0920   CHOLHDL 4 12/21/2017 0915   VLDL 32.2 12/21/2017 0915   LDLCALC 96 04/21/2018 0920   Hepatic Function Panel     Component Value Date/Time   PROT 6.9 04/21/2018 0920   ALBUMIN 4.2 04/21/2018 0920   AST 13 04/21/2018 0920   ALT 14  04/21/2018 0920   ALKPHOS 101 04/21/2018 0920   BILITOT 0.5 04/21/2018 0920   BILIDIR 0.1 12/21/2017 0915      Component Value Date/Time   TSH 2.180 04/21/2018 0920   TSH 2.65 12/21/2017 0915    Ref. Range 04/21/2018 09:20  Vitamin D, 25-Hydroxy Latest Ref Range: 30.0 - 100.0 ng/mL 24.5 (L)     OBESITY BEHAVIORAL INTERVENTION VISIT  Today's visit was # 7   Starting weight: 363 lbs Starting date: 04/21/2018 Today's weight :: (!) 331 lbs Today's date: 08/23/2018 Total lbs lost to date: 22   ASK: We discussed the diagnosis of obesity with Justin Bowen today and Justin Bowen agreed to give [place] permission to discuss obesity behavioral modification therapy today.  ASSESS: Justin Bowen has the diagnosis of obesity and his BMI today is 44.88 Massimo is in the action stage of change   ADVISE: Shayan was educated on the multiple health risks of obesity as well as the benefit of weight loss to improve his health. He was advised of the need for long term treatment and the importance of lifestyle modifications to improve his current health and to decrease his risk of future health problems.  AGREE: Multiple dietary modification options and treatment options were discussed and  Eliazar agreed to follow the recommendations documented in the above note.  ARRANGE: Macintyre was educated on the importance of frequent visits to treat obesity as outlined per CMS and USPSTF guidelines and agreed to schedule his next follow up appointment today.  I, Tammy Wysor, am acting as Energy manager for [employer], PA-C I, Alois Cliche, PA-C have reviewed above note and agree with its content

## 2018-08-28 MED FILL — TAMSULOSIN HCL 0.4 MG CAP: 0.4 | 30 days supply | Qty: 30 | Fill #2

## 2018-09-06 ENCOUNTER — Ambulatory Visit: Payer: Self-pay

## 2018-09-06 ENCOUNTER — Ambulatory Visit (INDEPENDENT_AMBULATORY_CARE_PROVIDER_SITE_OTHER): Payer: No Typology Code available for payment source

## 2018-09-06 ENCOUNTER — Encounter: Payer: Self-pay | Admitting: Family Medicine

## 2018-09-06 ENCOUNTER — Ambulatory Visit (INDEPENDENT_AMBULATORY_CARE_PROVIDER_SITE_OTHER): Payer: No Typology Code available for payment source | Admitting: Family Medicine

## 2018-09-06 VITALS — BP 128/80 | HR 65 | Temp 97.3°F | Ht 72.0 in

## 2018-09-06 DIAGNOSIS — S93602A Unspecified sprain of left foot, initial encounter: Secondary | ICD-10-CM

## 2018-09-06 DIAGNOSIS — N401 Enlarged prostate with lower urinary tract symptoms: Secondary | ICD-10-CM

## 2018-09-06 DIAGNOSIS — R3912 Poor urinary stream: Secondary | ICD-10-CM

## 2018-09-06 DIAGNOSIS — R609 Edema, unspecified: Secondary | ICD-10-CM

## 2018-09-06 DIAGNOSIS — S93602D Unspecified sprain of left foot, subsequent encounter: Secondary | ICD-10-CM | POA: Insufficient documentation

## 2018-09-06 NOTE — Progress Notes (Signed)
Established Patient Office Visit  Subjective:  Patient ID: Justin GatesRichard Gondek, male    DOB: 08/12/1960  Age: 58 y.o. MRN: [id number]  CC:  Chief Complaint  Patient presents with  . left foot injury    HPI Justin GatesRichard Tritschler presents for evaluation of an ongoing issue with his left foot.  He thinks that he may have injured it a few years ago.  He has experienced chronic intermittent pain in the middle dorsal area of his forefoot.  Pain is worse after standing or using a ladder.  He cannot remember if he stepped in a hole or dropped a cylinder block on it.  It is currently swollen and painful.  He has used the Voltaren gel on it with good result.  He is taking ibuprofen as well.  Tells that his edema was well controlled with the 12.5 mg of chlorthalidone but with the recent problems with his foot the swelling has increased through the dorsum of his foot.  LUTS symptoms are well controlled with the Flomax.  He continues to lose weight with the weight loss clinic.  Past Medical History:  Diagnosis Date  . BPH (benign prostatic hyperplasia)   . Chicken pox   . Fatigue   . Heartburn   . Knee pain   . Leg edema     Past Surgical History:  Procedure Laterality Date  . URETHRA SURGERY      Family History  Problem Relation Age of Onset  . Stroke Father [id number]  . Cancer Brother        brain cancer   . Cancer Paternal Grandmother [id number]       pancreatic cancer     Social History   Socioeconomic History  . Marital status: Married    Spouse name: Rona RavensValerie Lawhorn  . Number of children: Not on file  . Years of education: Not on file  . Highest education level: Not on file  Occupational History  . Not on file  Social Needs  . Financial resource strain: Not on file  . Food insecurity:    Worry: Not on file    Inability: Not on file  . Transportation needs:    Medical: Not on file    Non-medical: Not on file  Tobacco Use  . Smoking status: Never Smoker  . Smokeless tobacco: Never Used  Substance  and Sexual Activity  . Alcohol use: Never    Frequency: Never  . Drug use: Never  . Sexual activity: Not on file  Lifestyle  . Physical activity:    Days per week: Not on file    Minutes per session: Not on file  . Stress: Not on file  Relationships  . Social connections:    Talks on phone: Not on file    Gets together: Not on file    Attends religious service: Not on file    Active member of club or organization: Not on file    Attends meetings of clubs or organizations: Not on file    Relationship status: Not on file  . Intimate partner violence:    Fear of current or ex partner: Not on file    Emotionally abused: Not on file    Physically abused: Not on file    Forced sexual activity: Not on file  Other Topics Concern  . 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 chlorthalidone (HYGROTON) 25 MG tablet Take 0.5 tablets (12.5 mg total) by mouth daily. 90 tablet 3  . CRANBERRY EXTRACT PO Take by mouth.    . Cyanocobalamin (B-12 PO) Take by mouth.    . diclofenac sodium (VOLTAREN) 1 % GEL Apply 2 g topically 4 (four) times daily. 200 g 2  . metFORMIN (GLUCOPHAGE) 500 MG tablet TAKE 1 TABLET BY MOUTH ONCE DAILY WITH BREAKFAST 30 tablet 0  . phenazopyridine (PYRIDIUM) 97 MG tablet Take 2 tablets by mouth in the morning.    . sildenafil (REVATIO) 20 MG tablet Take 3-5 tablets 45 minutes prior daily as needed. 50 tablet 1  . tamsulosin (FLOMAX) 0.4 MG CAPS capsule Take 1 capsule (0.4 mg total) by mouth daily. 90 capsule 1  . Vitamin D, Ergocalciferol, (DRISDOL) 1.25 MG (50000 UT) CAPS capsule Take 1 capsule (50,000 Units total) by mouth every 7 (seven) days. 4 capsule 0   No facility-administered medications prior to visit.     Allergies  Allergen Reactions  . Sulfa Antibiotics Rash    ROS Review of Systems  Constitutional: Negative.     Respiratory: Negative.   Cardiovascular: Negative.   Gastrointestinal: Negative.   Endocrine: Negative for polyphagia and polyuria.  Genitourinary: Negative for difficulty urinating, frequency and urgency.  Musculoskeletal: Positive for arthralgias, gait problem and joint swelling.  Skin: Positive for color change. Negative for pallor and rash.  Allergic/Immunologic: Negative for immunocompromised state.  Neurological: Negative for weakness, light-headedness and numbness.  Psychiatric/Behavioral: Negative.       Objective:    Physical Exam  Constitutional: He is oriented to person, place, and time. He appears well-developed and well-nourished. No distress.  HENT:  Head: Normocephalic and atraumatic.  Right Ear: External ear normal.  Left Ear: External ear normal.  Eyes: Right eye exhibits no discharge. Left eye exhibits no discharge. No scleral icterus.  Neck: No JVD present. No tracheal deviation present.  Cardiovascular:  Pulses:      Dorsalis pedis pulses are 1+ on the right side and 1+ on the left side.       Posterior tibial pulses are 2+ on the right side and 2+ on the left side.  Pulmonary/Chest: Effort normal. No stridor.  Musculoskeletal:     Left foot: Decreased range of motion. Normal capillary refill. Tenderness, bony tenderness and swelling present.  Neurological: He is alert and oriented to person, place, and time.  Skin: He is not diaphoretic.  Psychiatric: He has a normal mood and affect. His behavior is normal.    BP 128/80   Pulse 65   Temp (!) 97.3 F (36.3 C) (Oral)   Ht 6' (1.829 m)   SpO2 97%   BMI 44.89 kg/m  Wt Readings from Last 3 Encounters:  08/23/18 (!) 331 lb (150.1 kg)  08/09/18 (!) 338 lb 6 oz (153.5 kg)  07/13/18 (!) 334 lb (151.5 kg)   BP Readings from Last 3 Encounters:  09/06/18 128/80  08/23/18 110/70  08/09/18 128/78   Guideline developer:  UpToDate (see UpToDate for funding source) Date Released: June 2014  There are no  preventive care reminders to display for this patient.  There are no preventive care reminders to display for this patient.  Lab Results  Component Value Date   TSH 2.180 04/21/2018   Lab Results  Component Value Date   WBC 6.0 04/21/2018   HGB 14.1 04/21/2018   HCT 41.7 04/21/2018   MCV 86  04/21/2018   PLT 276.0 12/21/2017   Lab Results  Component Value Date   NA 138 08/09/2018   K 3.8 08/09/2018   CO2 27 08/09/2018   GLUCOSE 106 (H) 08/09/2018   BUN 29 (H) 08/09/2018   CREATININE 1.21 08/09/2018   BILITOT 0.5 04/21/2018   ALKPHOS 101 04/21/2018   AST 13 04/21/2018   ALT 14 04/21/2018   PROT 6.9 04/21/2018   ALBUMIN 4.2 04/21/2018   CALCIUM 9.9 08/09/2018   GFR 65.40 08/09/2018   Lab Results  Component Value Date   CHOL 159 04/21/2018   Lab Results  Component Value Date   HDL 34 (L) 04/21/2018   Lab Results  Component Value Date   LDLCALC 96 04/21/2018   Lab Results  Component Value Date   TRIG 146 04/21/2018   Lab Results  Component Value Date   CHOLHDL 4 12/21/2017   Lab Results  Component Value Date   HGBA1C 4.9 04/21/2018      Assessment & Plan:   Problem List Items Addressed This Visit      Musculoskeletal and Integument   Foot sprain, left, initial encounter - Primary   Relevant Orders   DG Foot Complete Left   Ambulatory referral to Sports Medicine     Other   Benign prostatic hyperplasia with weak urinary stream   Edema      No orders of the defined types were placed in this encounter.   Follow-up: Return if symptoms worsen or fail to improve.   Check a x-ray today.  Continue Voltaren gel as needed.  Follow-up with sports medicine for consideration of work-up for stress fracture/inserts.  Patient is losing weight through the weight loss clinic.  Continue medicines as above.  Patient was given information on sprains.

## 2018-09-06 NOTE — Telephone Encounter (Signed)
Pt called to say he feels he may have factured his left foot.  He says that he as repeated the injury over and over in the past year.  He give Hx of stepping into a whole about 3 days ago.  His left foot has some bruising and mild swelling. He is able to bare weight. He rates pain at 5 on the pain scale. He is using a lidocaine cream. Appointment scheduled per protocol.  Care advice read to patient.  Pt verbalized understanding of all instructions. Reason for Disposition . [1] Limp when walking AND [2] due to a twisted ankle or foot  Answer Assessment - Initial Assessment Questions 1. MECHANISM: "How did the injury happen?" (e.g., twisting injury, direct blow)      Stepping into a whole last year keeps reinjuring 2. ONSET: "When did the injury happen?" (Minutes or hours ago)      3 days ago 3. LOCATION: "Where is the injury located?"      Left foot oposite side of arch 4. APPEARANCE of INJURY: "What does the injury look like?"      Bruised and swollen 5. WEIGHT-BEARING: "Can you put weight on that foot?" "Can you walk (four steps or more)?"       Yes can bare weight 6. SIZE: For cuts, bruises, or swelling, ask: "How large is it?" (e.g., inches or centimeters;  entire joint)    No  7. PAIN: "Is there pain?" If so, ask: "How bad is the pain?"    (e.g., Scale 1-10; or mild, moderate, severe)     5 moderate 8. TETANUS: For any breaks in the skin, ask: "When was the last tetanus booster?"     N/A booster done 2 months ago 9. OTHER SYMPTOMS: "Do you have any other symptoms?"      No other symptoms 10. PREGNANCY: "Is there any chance you are pregnant?" "When was your last menstrual period?"       N/A  Protocols used: FOOT AND ANKLE INJURY-A-AH

## 2018-09-06 NOTE — Telephone Encounter (Signed)
Noted  

## 2018-09-06 NOTE — Patient Instructions (Signed)
Foot Sprain    A foot sprain is an injury to one of the strong bands of tissue (ligaments) that connect and support the bones in your feet. The ligament can be stretched too much and tear. A tear can be either partial or complete. The severity of the sprain depends on how much of the ligament was damaged or torn.  What are the causes?  This condition is usually caused by suddenly twisting or pivoting your foot.  What increases the risk?  This injury is more likely to occur in people who:  · Play a sport, such as basketball or football.  · Exercise or play a sport without warming up.  · Start a new workout or sport.  · Suddenly increase how long or hard they exercise or play a sport.  · Have previously injured their foot or ankle.  What are the signs or symptoms?  Symptoms of this condition start soon after an injury and include:  · Pain, especially in the arch of the foot.  · Bruising.  · Swelling.  · Inability to walk or use the foot to support body weight.  How is this diagnosed?  This condition is diagnosed with a medical history and physical exam. You may also have imaging tests, such as:  · X-rays to make sure there are no broken bones (fractures).  · An MRI to see if the ligament is torn.  How is this treated?  Treatment for this condition depends on the severity of the sprain.  · Mild sprains can be treated with:  ? Rest, ice, compression, and elevation (RICE).  ? Keeping your foot in a fixed position (immobilization) for a period of time. This is done if your ligament is overstretched or partially torn. Your health care provider will apply a bandage, splint, or walking boot to keep your foot from moving until it heals.  ? Using crutches or a scooter for a few weeks to avoid putting weight on your foot while it is healing.  · Major sprains can be treated with:  ? Surgery. This is done if your ligament is fully torn and a procedure is needed to reconnect it to the bone.  ? A cast or splint. This will be needed  after surgery. A cast or splint will need to stay on your foot while it heals.  · In both types of sprains, you may need to exercise or have physical therapy to strengthen your foot.  Follow these instructions at home:  If you have a bandage, splint, or boot:  · Wear the bandage, splint, or boot as told by your health care provider. Remove only as told by your health care provider.  · Loosen the bandage, splint, or boot if your toes tingle, become numb, or turn cold and blue.  · Keep the bandage, splint, or boot clean and dry.  If you have a cast:  · Do not stick anything inside the cast to scratch your skin. Doing that increases your risk for infection.  · Check the skin around the cast every day. Tell your health care provider about any concerns.  · You may put lotion on dry skin around the edges of the cast. Do not put lotion on the skin underneath the cast.  · Keep the cast clean and dry.  Bathing  · Do not take baths, swim, or use a hot tub until your health care provider approves. Ask your health care provider if you may take showers. You   may only be allowed to take sponge baths.  · If the bandage, splint, boot, or cast is not waterproof:  ? Do not let it get wet.  ? Cover it with a watertight covering when you take a shower.  Managing pain, stiffness, and swelling    · If directed, put ice on the injured area:  ? If you have a removable splint, boot, or immobilizer, remove it as told by your health care provider.  ? Put ice in a plastic bag.  ? Place a towel between your skin and the bag.  ? Leave the ice on for 20 minutes, 2-3 times per day.  · Move your toes often to avoid stiffness and to lessen swelling.  · Raise (elevate) the injured area above the level of your heart while you are sitting or lying down.  Driving  · Do not drive or operate heavy machinery while taking pain medicine.  · Ask your health care provider when it is safe to drive if you have a bandage, splint, or walking boot on your  foot.  Activity  · Do not use the injured foot to support your body weight until your health care provider says that you can. Use crutches or other supportive devices as directed by your health care provider.  · Ask your health care provider what activities are safe for you. Do any exercise or physical therapy as directed.  · Gradually increase how much and how far you walk until your health care provider says it is safe to return to full activity.  General instructions  · If you have a cast, do not put pressure on any part of it until it is fully hardened. This may take several hours.  · Take over-the-counter and prescription medicines only as told by your health care provider.  · When you can walk without pain, wear supportive shoes that have stiff soles. Do not wear flip-flops, and do not walk barefoot.  · Keep all follow-up visits as told by your health care provider. This is important.  Contact a health care provider if:  · Your pain is not controlled with medicine.  · Your bruising or swelling gets worse or does not get better with treatment.  · Your splint, boot, or cast is damaged.  Get help right away if:  · You develop severe numbness or tingling in your foot.  · Your foot turns blue, white, or gray, and it feels cold.  Summary  · A foot sprain is an injury to one of the strong bands of tissue (ligaments) that connect and support the bones in your feet.  · Your health care provider may recommend a splint or boot for your foot to support it while it heals. In some cases, surgery may be needed.  · Physical therapy can help keep your other muscles strong until your foot gets better.  This information is not intended to replace advice given to you by your health care provider. Make sure you discuss any questions you have with your health care provider.  Document Released: 01/09/2002 Document Revised: 07/24/2017 Document Reviewed: 07/24/2017  Elsevier Interactive Patient Education © 2019 [employer]

## 2018-09-08 ENCOUNTER — Ambulatory Visit (INDEPENDENT_AMBULATORY_CARE_PROVIDER_SITE_OTHER): Payer: No Typology Code available for payment source | Admitting: Family Medicine

## 2018-09-08 ENCOUNTER — Ambulatory Visit (INDEPENDENT_AMBULATORY_CARE_PROVIDER_SITE_OTHER): Payer: No Typology Code available for payment source

## 2018-09-08 ENCOUNTER — Encounter: Payer: Self-pay | Admitting: Family Medicine

## 2018-09-08 VITALS — BP 124/100 | HR 66 | Temp 97.5°F | Ht 72.0 in | Wt 341.8 lb

## 2018-09-08 DIAGNOSIS — S93602A Unspecified sprain of left foot, initial encounter: Secondary | ICD-10-CM

## 2018-09-08 MED ORDER — DICLOFENAC SODIUM 2 % TD SOLN: 1.0000 "application " | Freq: Two times a day (BID) | TRANSDERMAL | 3 refills | Status: DC

## 2018-09-08 NOTE — Assessment & Plan Note (Signed)
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  Could be that he needs extra support underneath this area to help elevate and avoid the compression when dorsiflexion is occurring. -Provided Pennsaid. -Could consider custom orthotics to has extra support. - counseled on compression.  -If no improvement may need to consider physical therapy

## 2018-09-08 NOTE — Progress Notes (Addendum)
Justin Bowen - 59 y.o. male MRN [id number]  Date of birth: April 06, 1961  SUBJECTIVE:  Including CC & ROS.  Chief Complaint  Patient presents with  . Foot Pain    pt here for left foot pain , pt injured foot years ago. pt sts pain has been going on for 3 days now    Justin Bowen is a 58 y.o. male that is is presenting with right anterior midfoot pain.  He reports he has this pain anytime that he uses a ladder.  The pain is localized to this area and has been ongoing for the past 3 days.  Has not had any medicines or treatments that have improved the pain.  It is usually self-limiting.  He does notice swelling in this area.  Denies any ecchymosis or numbness.  Reports he hurt his foot sometime ago.  He is undergoing weight loss and has lost recently about 60 pounds.  Denies a trauma.  Independent review of the left foot x-ray from 2/4 shows mild degenerative changes of the midfoot.   Review of Systems  Constitutional: Negative for fever.  HENT: Negative for congestion.   Respiratory: Negative for cough.   Cardiovascular: Negative for chest pain.  Gastrointestinal: Negative for abdominal pain.  Musculoskeletal: Positive for joint swelling.  Skin: Negative for color change.  Neurological: Negative for weakness.  Hematological: Negative for adenopathy.  Psychiatric/Behavioral: Negative for agitation.    HISTORY: Past Medical, Surgical, Social, and Family History Reviewed & Updated per EMR.   Pertinent Historical Findings include:  Past Medical History:  Diagnosis Date  . BPH (benign prostatic hyperplasia)   . Chicken pox   . Fatigue   . Heartburn   . Knee pain   . Leg edema     Past Surgical History:  Procedure Laterality Date  . URETHRA SURGERY      Allergies  Allergen Reactions  . Sulfa Antibiotics Rash    Family History  Problem Relation Age of Onset  . Stroke Father 48  . Cancer Brother        brain cancer   . Cancer Paternal Grandmother 71       pancreatic cancer       Social History   Socioeconomic History  . Marital status: Married    Spouse name: Denver Kibbey  . Number of children: Not on file  . Years of education: Not on file  . Highest education level: Not on file  Occupational History  . Not on file  Social Needs  . Financial resource strain: Not on file  . Food insecurity:    Worry: Not on file    Inability: Not on file  . Transportation needs:    Medical: Not on file    Non-medical: Not on file  Tobacco Use  . Smoking status: Never Smoker  . Smokeless tobacco: Never Used  Substance and Sexual Activity  . Alcohol use: Never    Frequency: Never  . Drug use: Never  . Sexual activity: Not on file  Lifestyle  . Physical activity:    Days per week: Not on file    Minutes per session: Not on file  . Stress: Not on file  Relationships  . Social connections:    Talks on phone: Not on file    Gets together: Not on file    Attends religious service: Not on file    Active member of club or organization: Not on file    Attends meetings of clubs or organizations:  Not on file    Relationship status: Not on file  . Intimate partner violence:    Fear of current or ex partner: Not on file    Emotionally abused: Not on file    Physically abused: Not on file    Forced sexual activity: Not on file  Other Topics Concern  . 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, theatre/television/filmmanager    Married      PHYSICAL EXAM:  VS: BP (!) 124/100   Pulse 66   Temp (!) 97.5 F (36.4 C) (Oral)   Ht 6' (1.829 m)   Wt (!) 341 lb 12.8 oz (155 kg)   SpO2 95%   BMI 46.36 kg/m  Physical Exam Gen: NAD, alert, cooperative with exam, well-appearing ENT: normal lips, normal nasal mucosa,  Eye: normal EOM, normal conjunctiva and lids CV:  no edema, +2 pedal pulses   Resp: no accessory muscle use, non-labored,  Skin: no rashes, no areas of induration  Neuro: normal tone, normal sensation to touch Psych:  normal insight,  alert and oriented MSK:  Left foot: Mild dorsal midfoot swelling. No ecchymosis. Some tenderness to palpation over the anterior dorsal midfoot. Normal ankle range of motion. Neurovascular intact  Limited ultrasound: Left foot:  No effusion appreciated within the ankle joint itself. There appears to be effusion within the Chophart joint as well as the transition to the cuneiform bones. Normal-appearing anterior tibialis and extensors.   Summary: Findings suggestive of soft tissue swelling above the Chophart joint with effusion in this area that would suggest to be a source of his pain.  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  Could be that he needs extra support underneath this area to help elevate and avoid the compression when dorsiflexion is occurring. -Provided Pennsaid. -Could consider custom orthotics to has extra support. - counseled on compression.  -If no improvement may need to consider physical therapy

## 2018-09-08 NOTE — Patient Instructions (Signed)
Nice to meet you  Please try the compression  Please try ice as needed Please follow up to have custom orthotics made.

## 2018-09-15 ENCOUNTER — Encounter (INDEPENDENT_AMBULATORY_CARE_PROVIDER_SITE_OTHER): Payer: Self-pay | Admitting: Physician Assistant

## 2018-09-15 ENCOUNTER — Other Ambulatory Visit (INDEPENDENT_AMBULATORY_CARE_PROVIDER_SITE_OTHER): Payer: Self-pay

## 2018-09-15 DIAGNOSIS — E559 Vitamin D deficiency, unspecified: Secondary | ICD-10-CM

## 2018-09-15 DIAGNOSIS — E8881 Metabolic syndrome: Secondary | ICD-10-CM

## 2018-09-15 MED ORDER — VITAMIN D (ERGOCALCIFEROL) 1.25 MG (50000 UNIT) PO CAPS: 50000.0000 [IU] | ORAL_CAPSULE | ORAL | 0 refills | Status: DC

## 2018-09-15 MED ORDER — METFORMIN HCL 500 MG PO TABS: ORAL_TABLET | ORAL | 0 refills | Status: DC

## 2018-09-15 MED FILL — VIT D2 1.25 MG (50,000 UNIT: 1.25 MG | 28 days supply | Qty: 4 | Fill #0

## 2018-09-15 MED FILL — metFORMIN HCL 500 MG TABS: 500 | 30 days supply | Qty: 30 | Fill #0

## 2018-09-28 ENCOUNTER — Ambulatory Visit: Payer: No Typology Code available for payment source | Admitting: Family Medicine

## 2018-09-29 MED FILL — TAMSULOSIN HCL 0.4 MG CAP: 0.4 | 30 days supply | Qty: 30 | Fill #3

## 2018-10-05 ENCOUNTER — Ambulatory Visit (INDEPENDENT_AMBULATORY_CARE_PROVIDER_SITE_OTHER): Payer: No Typology Code available for payment source | Admitting: Physician Assistant

## 2018-10-05 VITALS — BP 125/75 | HR 70 | Temp 97.6°F | Ht 72.0 in | Wt 324.0 lb

## 2018-10-05 DIAGNOSIS — E7849 Other hyperlipidemia: Secondary | ICD-10-CM

## 2018-10-05 DIAGNOSIS — E559 Vitamin D deficiency, unspecified: Secondary | ICD-10-CM | POA: Diagnosis not present

## 2018-10-05 DIAGNOSIS — E8881 Metabolic syndrome: Secondary | ICD-10-CM

## 2018-10-05 DIAGNOSIS — Z9189 Other specified personal risk factors, not elsewhere classified: Secondary | ICD-10-CM

## 2018-10-05 DIAGNOSIS — Z6841 Body Mass Index (BMI) 40.0 and over, adult: Secondary | ICD-10-CM

## 2018-10-05 MED ORDER — VITAMIN D (ERGOCALCIFEROL) 1.25 MG (50000 UNIT) PO CAPS: 50000.0000 [IU] | ORAL_CAPSULE | ORAL | 0 refills | Status: DC

## 2018-10-05 MED ORDER — METFORMIN HCL 500 MG PO TABS: ORAL_TABLET | ORAL | 0 refills | Status: DC

## 2018-10-05 NOTE — Progress Notes (Signed)
Office: [phone]  /  Fax: [phone]   HPI:   Chief Complaint: OBESITY Justin Bowen is here to discuss his progress with his obesity treatment plan. He is on the Category 3 plan and is following his eating plan approximately 80% of the time. He states he is doing yard work 6 minutes 3-4 times per week. Justin Bowen did very well with weight loss. He has been out-of-town with work but was still able to eat on the plan.  His weight is (!) 324 lb (147 kg) today and has had a weight loss of 7 pounds over a period of 6 weeks since his last visit. He has lost 39 lbs since starting treatment with [place].  Hyperlipidemia Justin Bowen has hyperlipidemia and has been trying to improve his cholesterol levels with intensive lifestyle modification including a low saturated fat diet, exercise and weight loss. He is currently on no medication and denies any chest pain.  Insulin Resistance Justin Bowen has a diagnosis of insulin resistance based on his elevated fasting insulin level >5. Although Justin Bowen's blood glucose readings are still under good control, insulin resistance puts him at greater risk of metabolic syndrome and diabetes. He is taking metformin currently and continues to work on diet and exercise to decrease risk of diabetes. He denies nausea, vomiting, or diarrhea.  At risk for diabetes Justin Bowen is at higher than averagerisk for developing diabetes due to his obesity. He currently denies polyuria or polydipsia.  Vitamin D deficiency Justin Bowen has a diagnosis of Vitamin D deficiency. He is currently taking prescription Vit D and denies nausea, vomiting or muscle weakness.  ASSESSMENT AND PLAN:  Other hyperlipidemia - Plan: Lipid Panel With LDL/HDL Ratio  Insulin resistance - Plan: Comprehensive metabolic panel, Insulin, random, Hemoglobin A1c, metFORMIN (GLUCOPHAGE) 500 MG tablet  Vitamin D deficiency - Plan: VITAMIN D 25 Hydroxy (Vit-D Deficiency, Fractures), Vitamin D, Ergocalciferol, (DRISDOL) 1.25 MG  (50000 UT) CAPS capsule  At risk for diabetes mellitus  Class 3 severe obesity with serious comorbidity and body mass index (BMI) of 40.0 to 44.9 in adult, unspecified obesity type (HCC)  PLAN:  Hyperlipidemia Justin Bowen was informed of the American Heart Association Guidelines emphasizing intensive lifestyle modifications as the first line treatment for hyperlipidemia. We discussed many lifestyle modifications today in depth, and Steen will continue to work on decreasing saturated fats such as fatty red meat, butter and many fried foods. He will also increase vegetables and lean protein in his diet and continue to work on exercise and weight loss efforts. We will check labs today.  Insulin Resistance Justin Bowen will continue to work on weight loss, exercise, and decreasing simple carbohydrates in his diet to help decrease the risk of diabetes. We dicussed metformin including benefits and risks. He was informed that eating too many simple carbohydrates or too many calories at one sitting increases the likelihood of GI side effects. Justin Bowen is on metformin and a refill prescription was written today for #60 (2 month RX). Justin Bowen agreed to follow-up with [place] as directed to monitor his progress. We will check A1c and insulin today.  Diabetes risk counseling Justin Bowen was given extended (15 minutes) diabetes prevention counseling today. He is 58 y.o. male and has risk factors for diabetes including obesity. We discussed intensive lifestyle modifications today with an emphasis on weight loss as well as increasing exercise and decreasing simple carbohydrates in his diet.  Vitamin D Deficiency Justin Bowen was informed that low Vitamin D levels contributes to fatigue and are associated with obesity, breast, and colon  cancer. He agrees to continue to take prescription Vit D @ 50,000 IU every week #8 with 0 refills (2 month RX) and will have routine testing of Vitamin D today. He was informed of the risk of  over-replacement of Vitamin D and agrees to not increase her dose unless she discusses this with [place] first. Justin Bowen agrees to follow-up with our clinic in 6 weeks.  Obesity Justin Bowen is currently in the action stage of change. As such, his goal is to continue with weight loss efforts. He has agreed to follow the Category 3 plan. Justin Bowen has been instructed to work up to a goal of 150 minutes of combined cardio and strengthening exercise per week for weight loss and overall health benefits. We discussed the following Behavioral Modification Strategies today: work on meal planning and easy cooking plans.  Justin Bowen has agreed to follow-up with our clinic in 6 weeks. He was informed of the importance of frequent follow up visits to maximize his success with intensive lifestyle modifications for his multiple health conditions.  ALLERGIES: Allergies  Allergen Reactions  . Sulfa Antibiotics Rash    MEDICATIONS: Current Outpatient Medications on File Prior to Visit  Medication Sig Dispense Refill  . chlorthalidone (HYGROTON) 25 MG tablet Take 0.5 tablets (12.5 mg total) by mouth daily. 90 tablet 3  . CRANBERRY EXTRACT PO Take by mouth.    . Cyanocobalamin (B-12 PO) Take by mouth.    . Diclofenac Sodium (PENNSAID) 2 % SOLN Place 1 application onto the skin 2 (two) times daily. 1 Bottle 3  . diclofenac sodium (VOLTAREN) 1 % GEL Apply 2 g topically 4 (four) times daily. 200 g 2  . phenazopyridine (PYRIDIUM) 97 MG tablet Take 2 tablets by mouth in the morning.    . Probiotic Product (PROBIOTIC-10 PO) Take by mouth.    . sildenafil (REVATIO) 20 MG tablet Take 3-5 tablets 45 minutes prior daily as needed. 50 tablet 1  . tamsulosin (FLOMAX) 0.4 MG CAPS capsule Take 1 capsule (0.4 mg total) by mouth daily. 90 capsule 1   No current facility-administered medications on file prior to visit.     PAST MEDICAL HISTORY: Past Medical History:  Diagnosis Date  . BPH (benign prostatic hyperplasia)   .  Chicken pox   . Fatigue   . Heartburn   . Knee pain   . Leg edema     PAST SURGICAL HISTORY: Past Surgical History:  Procedure Laterality Date  . URETHRA SURGERY      SOCIAL HISTORY: Social History   Tobacco Use  . Smoking status: Never Smoker  . Smokeless tobacco: Never Used  Substance Use Topics  . Alcohol use: Never    Frequency: Never  . Drug use: Never    FAMILY HISTORY: Family History  Problem Relation Age of Onset  . Stroke Father 47  . Cancer Brother        brain cancer   . Cancer Paternal Grandmother 8       pancreatic cancer    ROS: Review of Systems  Constitutional: Positive for weight loss.  Cardiovascular: Negative for chest pain.  Gastrointestinal: Negative for diarrhea, nausea and vomiting.  Musculoskeletal:       Negative for muscle weakness.  Endo/Heme/Allergies:       Negative for hypoglycemia.   PHYSICAL EXAM: Blood pressure 125/75, pulse 70, temperature 97.6 F (36.4 C), temperature source Oral, height 6' (1.829 m), weight (!) 324 lb (147 kg), SpO2 96 %. Body mass index is 43.94 kg/m.  Physical Exam Vitals signs reviewed.  Constitutional:      Appearance: Normal appearance. He is obese.  Cardiovascular:     Rate and Rhythm: Normal rate.     Pulses: Normal pulses.  Pulmonary:     Effort: Pulmonary effort is normal.     Breath sounds: Normal breath sounds.  Musculoskeletal: Normal range of motion.  Skin:    General: Skin is warm and dry.  Neurological:     Mental Status: He is alert and oriented to person, place, and time.  Psychiatric:        Behavior: Behavior normal.   RECENT LABS AND TESTS: BMET    Component Value Date/Time   NA 138 08/09/2018 1112   NA 143 04/21/2018 0920   K 3.8 08/09/2018 1112   CL 103 08/09/2018 1112   CO2 27 08/09/2018 1112   GLUCOSE 106 (H) 08/09/2018 1112   BUN 29 (H) 08/09/2018 1112   BUN 16 04/21/2018 0920   CREATININE 1.21 08/09/2018 1112   CALCIUM 9.9 08/09/2018 1112   GFRNONAA 74  04/21/2018 0920   GFRAA 86 04/21/2018 0920   Lab Results  Component Value Date   HGBA1C 4.9 04/21/2018   HGBA1C 5.0 12/21/2017   Lab Results  Component Value Date   INSULIN 34.7 (H) 04/21/2018   CBC    Component Value Date/Time   WBC 6.0 04/21/2018 0920   WBC 7.1 12/21/2017 0915   RBC 4.85 04/21/2018 0920   RBC 5.16 12/21/2017 0915   HGB 14.1 04/21/2018 0920   HCT 41.7 04/21/2018 0920   PLT 276.0 12/21/2017 0915   MCV 86 04/21/2018 0920   MCH 29.1 04/21/2018 0920   MCHC 33.8 04/21/2018 0920   MCHC 35.1 12/21/2017 0915   RDW 13.0 04/21/2018 0920   LYMPHSABS 1.2 04/21/2018 0920   MONOABS 0.5 12/21/2017 0915   EOSABS 0.1 04/21/2018 0920   BASOSABS 0.0 04/21/2018 0920   Iron/TIBC/Ferritin/ %Sat No results found for: IRON, TIBC, FERRITIN, IRONPCTSAT Lipid Panel     Component Value Date/Time   CHOL 159 04/21/2018 0920   TRIG 146 04/21/2018 0920   HDL 34 (L) 04/21/2018 0920   CHOLHDL 4 12/21/2017 0915   VLDL 32.2 12/21/2017 0915   LDLCALC 96 04/21/2018 0920   Hepatic Function Panel     Component Value Date/Time   PROT 6.9 04/21/2018 0920   ALBUMIN 4.2 04/21/2018 0920   AST 13 04/21/2018 0920   ALT 14 04/21/2018 0920   ALKPHOS 101 04/21/2018 0920   BILITOT 0.5 04/21/2018 0920   BILIDIR 0.1 12/21/2017 0915      Component Value Date/Time   TSH 2.180 04/21/2018 0920   TSH 2.65 12/21/2017 0915    Ref. Range 04/21/2018 09:20  Vitamin D, 25-Hydroxy Latest Ref Range: 30.0 - 100.0 ng/mL 24.5 (L)   OBESITY BEHAVIORAL INTERVENTION VISIT  Today's visit was #8  Starting weight: 363 lbs Starting date: 04/21/2018 Today's weight: 324 lbs Today's date: 10/05/2018 Total lbs lost to date: 39    10/05/2018  Height 6' (1.829 m)  Weight 324 lb (147 kg) (A)  BMI (Calculated) 43.93  BLOOD PRESSURE - SYSTOLIC 125  BLOOD PRESSURE - DIASTOLIC 75   Body Fat % 41.2 %  Total Body Water (lbs) 141 lbs   ASK: We discussed the diagnosis of obesity with Justin Bowen today and  Justin Bowen agreed to give Justin Bowen permission to discuss obesity behavioral modification therapy today.  ASSESS: Justin Bowen has the diagnosis of obesity and his BMI today is 43.93.  Justin Bowen is in the action stage of change.   ADVISE: Justin Bowen was educated on the multiple health risks of obesity as well as the benefit of weight loss to improve his health. He was advised of the need for long term treatment and the importance of lifestyle modifications to improve his current health and to decrease his risk of future health problems.  AGREE: Multiple dietary modification options and treatment options were discussed and  Justin Bowen agreed to follow the recommendations documented in the above note.  ARRANGE: Justin Bowen was educated on the importance of frequent visits to treat obesity as outlined per CMS and USPSTF guidelines and agreed to schedule his next follow up appointment today.  Fernanda Drum, am acting as transcriptionist for Alois Cliche, PA-C I, Alois Cliche, PA-C have reviewed above note and agree with its content

## 2018-10-06 LAB — LIPID PANEL WITH LDL/HDL RATIO
CHOLESTEROL TOTAL: 165 mg/dL (ref 100–199)
HDL: 32 mg/dL — ABNORMAL LOW (ref >39)
LDL Calculated: 105 mg/dL — ABNORMAL HIGH (ref 0–99)
LDl/HDL Ratio: 3.3 ratio (ref 0.0–3.6)
Triglycerides: 138 mg/dL (ref 0–149)
VLDL Cholesterol Cal: 28 mg/dL (ref 5–40)

## 2018-10-06 LAB — COMPREHENSIVE METABOLIC PANEL
ALT: 14 IU/L (ref 0–44)
AST: 9 IU/L (ref 0–40)
Albumin/Globulin Ratio: 1.5 (ref 1.2–2.2)
Albumin: 4.3 g/dL (ref 3.8–4.9)
Alkaline Phosphatase: 114 IU/L (ref 39–117)
BUN/Creatinine Ratio: 27 — ABNORMAL HIGH (ref 9–20)
BUN: 24 mg/dL (ref 6–24)
Bilirubin Total: 0.6 mg/dL (ref 0.0–1.2)
CHLORIDE: 104 mmol/L (ref 96–106)
CO2: 20 mmol/L (ref 20–29)
Calcium: 9 mg/dL (ref 8.7–10.2)
Creatinine, Ser: 0.89 mg/dL (ref 0.76–1.27)
GFR calc Af Amer: 109 mL/min/{1.73_m2} (ref >59)
GFR calc non Af Amer: 94 mL/min/{1.73_m2} (ref >59)
Globulin, Total: 2.9 g/dL (ref 1.5–4.5)
Glucose: 96 mg/dL (ref 65–99)
Potassium: 3.9 mmol/L (ref 3.5–5.2)
Sodium: 141 mmol/L (ref 134–144)
TOTAL PROTEIN: 7.2 g/dL (ref 6.0–8.5)

## 2018-10-06 LAB — HEMOGLOBIN A1C
Est. average glucose Bld gHb Est-mCnc: 100 mg/dL
Hgb A1c MFr Bld: 5.1 % (ref 4.8–5.6)

## 2018-10-06 LAB — INSULIN, RANDOM: INSULIN: 19.5 u[IU]/mL (ref 2.6–24.9)

## 2018-10-06 LAB — VITAMIN D 25 HYDROXY (VIT D DEFICIENCY, FRACTURES): Vit D, 25-Hydroxy: 37.8 ng/mL (ref 30.0–100.0)

## 2018-10-06 MED FILL — VIT D2 1.25 MG (50,000 UNIT: 1.25 MG | 56 days supply | Qty: 8 | Fill #0

## 2018-10-11 MED FILL — metFORMIN HCL 500 MG TABS: 500 | 60 days supply | Qty: 60 | Fill #0

## 2018-10-27 ENCOUNTER — Encounter (INDEPENDENT_AMBULATORY_CARE_PROVIDER_SITE_OTHER): Payer: Self-pay

## 2018-11-01 MED FILL — TAMSULOSIN HCL 0.4 MG CAP: 0.4 | 90 days supply | Qty: 90 | Fill #0

## 2018-11-05 ENCOUNTER — Other Ambulatory Visit (INDEPENDENT_AMBULATORY_CARE_PROVIDER_SITE_OTHER): Payer: Self-pay | Admitting: Physician Assistant

## 2018-11-05 DIAGNOSIS — E559 Vitamin D deficiency, unspecified: Secondary | ICD-10-CM

## 2018-11-05 DIAGNOSIS — E8881 Metabolic syndrome: Secondary | ICD-10-CM

## 2018-11-05 MED FILL — CHLORTHALIDONE 25 MG TABS: 25 | 90 days supply | Qty: 45 | Fill #0

## 2018-11-16 ENCOUNTER — Ambulatory Visit (INDEPENDENT_AMBULATORY_CARE_PROVIDER_SITE_OTHER): Payer: No Typology Code available for payment source | Admitting: Physician Assistant

## 2018-11-24 ENCOUNTER — Encounter: Payer: Self-pay | Admitting: Family Medicine

## 2018-12-01 ENCOUNTER — Encounter: Payer: Self-pay | Admitting: Family Medicine

## 2018-12-01 ENCOUNTER — Ambulatory Visit (INDEPENDENT_AMBULATORY_CARE_PROVIDER_SITE_OTHER): Payer: No Typology Code available for payment source | Admitting: Family Medicine

## 2018-12-01 VITALS — Ht 72.0 in

## 2018-12-01 DIAGNOSIS — L255 Unspecified contact dermatitis due to plants, except food: Secondary | ICD-10-CM | POA: Diagnosis not present

## 2018-12-01 DIAGNOSIS — E559 Vitamin D deficiency, unspecified: Secondary | ICD-10-CM | POA: Diagnosis not present

## 2018-12-01 MED ORDER — PREDNISONE 10 MG (48) PO TBPK: ORAL_TABLET | ORAL | 0 refills | Status: DC

## 2018-12-01 MED FILL — predniSONE 10 MG (48) TBPK: 10 | 12 days supply | Qty: 48 | Fill #0

## 2018-12-01 NOTE — Progress Notes (Signed)
Established Patient Office Visit  Subjective:  Patient ID: Justin GatesRichard Bowen, male    DOB: 1961/01/09  Age: 58 y.o. MRN: [id number]  CC:  Chief Complaint  Patient presents with  . Poison Ivy    HPI Justin GatesRichard Bowen presents for evaluation and treatment of a 3 to 4-day history of a pruritic rash on his arms chest legs.  There is some swelling in his periorbital areas.  Rash came up after he had been clean brush around his yard 3 to 4 days ago.  Recent vitamin D level was measured at 37.8.  Hemoglobin A1c was 5.1.  Insulin level was 19.5.  He continues to lose some weight with his weight loss efforts  Past Medical History:  Diagnosis Date  . BPH (benign prostatic hyperplasia)   . Chicken pox   . Fatigue   . Heartburn   . Knee pain   . Leg edema     Past Surgical History:  Procedure Laterality Date  . URETHRA SURGERY      Family History  Problem Relation Age of Onset  . Stroke Father [id number]  . Cancer Brother        brain cancer   . Cancer Paternal Grandmother [id number]       pancreatic cancer     Social History   Socioeconomic History  . Marital status: Married    Spouse name: Rona RavensValerie Strojny  . Number of children: Not on file  . Years of education: Not on file  . Highest education level: Not on file  Occupational History  . Not on file  Social Needs  . Financial resource strain: Not on file  . Food insecurity:    Worry: Not on file    Inability: Not on file  . Transportation needs:    Medical: Not on file    Non-medical: Not on file  Tobacco Use  . Smoking status: Never Smoker  . Smokeless tobacco: Never Used  Substance and Sexual Activity  . Alcohol use: Never    Frequency: Never  . Drug use: Never  . Sexual activity: Not on file  Lifestyle  . Physical activity:    Days per week: Not on file    Minutes per session: Not on file  . Stress: Not on file  Relationships  . Social connections:    Talks on phone: Not on file    Gets together: Not on file    Attends  religious service: Not on file    Active member of club or organization: Not on file    Attends meetings of clubs or organizations: Not on file    Relationship status: Not on file  . Intimate partner violence:    Fear of current or ex partner: Not on file    Emotionally abused: Not on file    Physically abused: Not on file    Forced sexual activity: Not on file  Other Topics Concern  . 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 chlorthalidone (HYGROTON) 25 MG tablet Take 0.5 tablets (12.5 mg total) by mouth daily. 90 tablet 3  . CRANBERRY EXTRACT PO Take by mouth.    . Cyanocobalamin (B-12 PO) Take by mouth.    . Diclofenac Sodium (PENNSAID) 2 % SOLN Place 1 application onto the skin 2 (two) times daily. 1 Bottle 3  .  diclofenac sodium (VOLTAREN) 1 % GEL Apply 2 g topically 4 (four) times daily. 200 g 2  . metFORMIN (GLUCOPHAGE) 500 MG tablet TAKE 1 TABLET BY MOUTH ONCE DAILY WITH BREAKFAST 60 tablet 0  . phenazopyridine (PYRIDIUM) 97 MG tablet Take 2 tablets by mouth in the morning.    . Probiotic Product (PROBIOTIC-10 PO) Take by mouth.    . sildenafil (REVATIO) 20 MG tablet Take 3-5 tablets 45 minutes prior daily as needed. 50 tablet 1  . tamsulosin (FLOMAX) 0.4 MG CAPS capsule Take 1 capsule (0.4 mg total) by mouth daily. 90 capsule 1  . Vitamin D, Ergocalciferol, (DRISDOL) 1.25 MG (50000 UT) CAPS capsule Take 1 capsule (50,000 Units total) by mouth every 7 (seven) days. 8 capsule 0   No facility-administered medications prior to visit.     Allergies  Allergen Reactions  . Sulfa Antibiotics Rash    ROS Review of Systems  Constitutional: Negative.   Respiratory: Negative.   Cardiovascular: Negative.   Gastrointestinal: Negative.   Endocrine: Negative for polyphagia and polyuria.  Skin: Positive for color change and rash.   Psychiatric/Behavioral: Negative.       Objective:    Physical Exam  Constitutional: He is oriented to person, place, and time. He appears well-developed and well-nourished. No distress.  HENT:  Head: Normocephalic and atraumatic.  Right Ear: External ear normal.  Left Ear: External ear normal.  Eyes: Conjunctivae are normal. Right eye exhibits no discharge. Left eye exhibits no discharge. No scleral icterus.  Pulmonary/Chest: Effort normal.  Neurological: He is alert and oriented to person, place, and time.  Skin: Skin is warm and dry. He is not diaphoretic.     Psychiatric: He has a normal mood and affect. His behavior is normal.    Ht 6' (1.829 m)   BMI 43.94 kg/m  Wt Readings from Last 3 Encounters:  10/05/18 (!) 324 lb (147 kg)  09/08/18 (!) 341 lb 12.8 oz (155 kg)  08/23/18 (!) 331 lb (150.1 kg)     There are no preventive care reminders to display for this patient.  There are no preventive care reminders to display for this patient.  Lab Results  Component Value Date   TSH 2.180 04/21/2018   Lab Results  Component Value Date   WBC 6.0 04/21/2018   HGB 14.1 04/21/2018   HCT 41.7 04/21/2018   MCV 86 04/21/2018   PLT 276.0 12/21/2017   Lab Results  Component Value Date   NA 141 10/05/2018   K 3.9 10/05/2018   CO2 20 10/05/2018   GLUCOSE 96 10/05/2018   BUN 24 10/05/2018   CREATININE 0.89 10/05/2018   BILITOT 0.6 10/05/2018   ALKPHOS 114 10/05/2018   AST 9 10/05/2018   ALT 14 10/05/2018   PROT 7.2 10/05/2018   ALBUMIN 4.3 10/05/2018   CALCIUM 9.0 10/05/2018   GFR 65.40 08/09/2018   Lab Results  Component Value Date   CHOL 165 10/05/2018   Lab Results  Component Value Date   HDL 32 (L) 10/05/2018   Lab Results  Component Value Date   LDLCALC 105 (H) 10/05/2018   Lab Results  Component Value Date   TRIG 138 10/05/2018   Lab Results  Component Value Date   CHOLHDL 4 12/21/2017   Lab Results  Component Value Date   HGBA1C 5.1  10/05/2018      Assessment & Plan:   Problem List Items Addressed This Visit      Musculoskeletal and Integument  Rhus dermatitis - Primary   Relevant Medications   predniSONE (STERAPRED UNI-PAK 48 TAB) 10 MG (48) TBPK tablet     Other   Vitamin D deficiency      Meds ordered this encounter  Medications  . predniSONE (STERAPRED UNI-PAK 48 TAB) 10 MG (48) TBPK tablet    Sig: Pharm to instruct a 12 day dose pack.    Dispense:  48 tablet    Refill:  0    Follow-up: Return has appointment in July.    Mliss Sax, MDVirtual Visit via Video Note  I connected with Justin Bowen on 12/01/18 at 11:30 AM EDT by a video enabled telemedicine application and verified that I am speaking with the correct person using two identifiers.  Location: Patient: home Provider: work    I discussed the limitations of evaluation and management by telemedicine and the availability of in person appointments. The patient expressed understanding and agreed to proceed.  History of Present Illness:    Observations/Objective:   Assessment and Plan:   Follow Up Instructions:    I discussed the assessment and treatment plan with the patient. The patient was provided an opportunity to ask questions and all were answered. The patient agreed with the plan and demonstrated an understanding of the instructions.   The patient was advised to call back or seek an in-person evaluation if the symptoms worsen or if the condition fails to improve as anticipated.  I provided 15 minutes of non-face-to-face time during this encounter.   Patient will discontinue his weekly vitamin D in favor of taking a multivitamin with vitamin D.  At this time we will hold his metformin.  We will start a 12-day prednisone Dosepak.  He has taken this medication before but reminded him of increased appetite and possible irritability.  He will follow-up at the end of July and we will recheck his vitamin D and  hemoglobin A1c at that time.

## 2018-12-27 ENCOUNTER — Encounter: Payer: No Typology Code available for payment source | Admitting: Adult Health

## 2018-12-28 ENCOUNTER — Encounter: Payer: No Typology Code available for payment source | Admitting: Family Medicine

## 2019-01-12 MED FILL — TAMSULOSIN HCL 0.4 MG CAP: 0.4 | 90 days supply | Qty: 90 | Fill #1

## 2019-02-06 ENCOUNTER — Telehealth: Payer: Self-pay

## 2019-02-06 NOTE — Telephone Encounter (Signed)

## 2019-02-07 ENCOUNTER — Encounter: Payer: Self-pay | Admitting: Family Medicine

## 2019-02-07 ENCOUNTER — Ambulatory Visit (INDEPENDENT_AMBULATORY_CARE_PROVIDER_SITE_OTHER): Payer: No Typology Code available for payment source | Admitting: Family Medicine

## 2019-02-07 VITALS — BP 118/76 | HR 99 | Ht 72.0 in | Wt 341.5 lb

## 2019-02-07 DIAGNOSIS — Z6841 Body Mass Index (BMI) 40.0 and over, adult: Secondary | ICD-10-CM

## 2019-02-07 DIAGNOSIS — N401 Enlarged prostate with lower urinary tract symptoms: Secondary | ICD-10-CM | POA: Diagnosis not present

## 2019-02-07 DIAGNOSIS — R609 Edema, unspecified: Secondary | ICD-10-CM | POA: Diagnosis not present

## 2019-02-07 DIAGNOSIS — Z0001 Encounter for general adult medical examination with abnormal findings: Secondary | ICD-10-CM

## 2019-02-07 DIAGNOSIS — E66813 Obesity, class 3: Secondary | ICD-10-CM

## 2019-02-07 DIAGNOSIS — Z9189 Other specified personal risk factors, not elsewhere classified: Secondary | ICD-10-CM

## 2019-02-07 DIAGNOSIS — E559 Vitamin D deficiency, unspecified: Secondary | ICD-10-CM | POA: Diagnosis not present

## 2019-02-07 DIAGNOSIS — E88819 Insulin resistance, unspecified: Secondary | ICD-10-CM

## 2019-02-07 DIAGNOSIS — E8881 Metabolic syndrome: Secondary | ICD-10-CM

## 2019-02-07 DIAGNOSIS — R3912 Poor urinary stream: Secondary | ICD-10-CM | POA: Diagnosis not present

## 2019-02-07 DIAGNOSIS — R030 Elevated blood-pressure reading, without diagnosis of hypertension: Secondary | ICD-10-CM

## 2019-02-07 DIAGNOSIS — R6882 Decreased libido: Secondary | ICD-10-CM

## 2019-02-07 LAB — COMPREHENSIVE METABOLIC PANEL
ALT: 14 U/L (ref 0–53)
AST: 12 U/L (ref 0–37)
Albumin: 4 g/dL (ref 3.5–5.2)
Alkaline Phosphatase: 104 U/L (ref 39–117)
BUN: 22 mg/dL (ref 6–23)
CO2: 26 mEq/L (ref 19–32)
Calcium: 8.7 mg/dL (ref 8.4–10.5)
Chloride: 104 mEq/L (ref 96–112)
Creatinine, Ser: 1.35 mg/dL (ref 0.40–1.50)
GFR: 54.14 mL/min — ABNORMAL LOW (ref >60.00)
Glucose, Bld: 97 mg/dL (ref 70–99)
Potassium: 4.1 mEq/L (ref 3.5–5.1)
Sodium: 140 mEq/L (ref 135–145)
Total Bilirubin: 0.7 mg/dL (ref 0.2–1.2)
Total Protein: 7.1 g/dL (ref 6.0–8.3)

## 2019-02-07 LAB — CBC
HCT: 42.3 % (ref 39.0–52.0)
Hemoglobin: 14.5 g/dL (ref 13.0–17.0)
MCHC: 34.3 g/dL (ref 30.0–36.0)
MCV: 84.4 fl (ref 78.0–100.0)
Platelets: 287 K/uL (ref 150.0–400.0)
RBC: 5.01 Mil/uL (ref 4.22–5.81)
RDW: 14.3 % (ref 11.5–15.5)
WBC: 7.8 K/uL (ref 4.0–10.5)

## 2019-02-07 LAB — HEMOGLOBIN A1C: Hgb A1c MFr Bld: 5.1 % (ref 4.6–6.5)

## 2019-02-07 LAB — URINALYSIS, ROUTINE W REFLEX MICROSCOPIC
Bilirubin Urine: NEGATIVE
Ketones, ur: NEGATIVE
Nitrite: NEGATIVE
RBC / HPF: NONE SEEN (ref 0–?)
Specific Gravity, Urine: 1.02 (ref 1.000–1.030)
Total Protein, Urine: NEGATIVE
Urine Glucose: NEGATIVE
Urobilinogen, UA: 0.2 (ref 0.0–1.0)
pH: 5.5 (ref 5.0–8.0)

## 2019-02-07 LAB — PSA: PSA: 0.85 ng/mL (ref 0.10–4.00)

## 2019-02-07 LAB — VITAMIN D 25 HYDROXY (VIT D DEFICIENCY, FRACTURES): VITD: 21.77 ng/mL — ABNORMAL LOW (ref 30.00–100.00)

## 2019-02-07 MED ORDER — TAMSULOSIN HCL 0.4 MG PO CAPS: 0.4000 mg | ORAL_CAPSULE | Freq: Every day | ORAL | 1 refills | Status: DC

## 2019-02-07 MED ORDER — CHLORTHALIDONE 25 MG PO TABS: 12.5000 mg | ORAL_TABLET | Freq: Every day | ORAL | 3 refills | Status: DC

## 2019-02-07 MED ORDER — METFORMIN HCL 500 MG PO TABS: ORAL_TABLET | ORAL | 1 refills | Status: DC

## 2019-02-07 MED ORDER — VITAMIN D (ERGOCALCIFEROL) 1.25 MG (50000 UNIT) PO CAPS: 50000.0000 [IU] | ORAL_CAPSULE | ORAL | 3 refills | Status: DC

## 2019-02-07 MED FILL — VIT D2 1.25 MG (50,000 UNIT: 1.25 MG | 56 days supply | Qty: 8 | Fill #0

## 2019-02-07 NOTE — Progress Notes (Signed)
Established Patient Office Visit  Subjective:  Patient ID: Justin Bowen, male    DOB: May 07, 1961  Age: 58 y.o. MRN: [id number]  CC:  Chief Complaint  Patient presents with  . Annual Exam    HPI Justin Bowen presents for a complete physical in follow-up of his edema, prediabetes, obesity and vitamin D deficiency.  Patient is staying active remodeling his home and working in his 6 acre yard.  He has gained some weight.  He is no longer seeing weight loss management.  He has not taken metformin in 3 weeks.  He tolerated the drug well.  He is due for an eye check and dental check.  Colonoscopy is up-to-date.  Edema has been controlled with the 12.5 mg of chlorthalidone.  He feels well today.  Flomax has made a world of difference in his urine  Past Medical History:  Diagnosis Date  . BPH (benign prostatic hyperplasia)   . Chicken pox   . Fatigue   . Heartburn   . Knee pain   . Leg edema     Past Surgical History:  Procedure Laterality Date  . URETHRA SURGERY      Family History  Problem Relation Age of Onset  . Stroke Father [id number]  . Cancer Brother        brain cancer   . Cancer Paternal Grandmother [id number]       pancreatic cancer     Social History   Socioeconomic History  . Marital status: Married    Spouse name: Rona RavensValerie Eastburn  . Number of children: Not on file  . Years of education: Not on file  . Highest education level: Not on file  Occupational History  . Not on file  Social Needs  . Financial resource strain: Not on file  . Food insecurity    Worry: Not on file    Inability: Not on file  . Transportation needs    Medical: Not on file    Non-medical: Not on file  Tobacco Use  . Smoking status: Never Smoker  . Smokeless tobacco: Never Used  Substance and Sexual Activity  . Alcohol use: Never    Frequency: Never  . Drug use: Never  . Sexual activity: Not on file  Lifestyle  . Physical activity    Days per week: Not on file    Minutes per session: Not on  file  . Stress: Not on file  Relationships  . Social Musicianconnections    Talks on phone: Not on file    Gets together: Not on file    Attends religious service: Not on file    Active member of club or organization: Not on file    Attends meetings of clubs or organizations: Not on file    Relationship status: Not on file  . Intimate partner violence    Fear of current or ex partner: Not on file    Emotionally abused: Not on file    Physically abused: Not on file    Forced sexual activity: Not on file  Other Topics Concern  . 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 CRANBERRY EXTRACT PO Take by mouth.    . Cyanocobalamin (B-12 PO) Take by mouth.    . Diclofenac Sodium (PENNSAID) 2 % SOLN Place 1 application onto the skin 2 (two)  times daily. 1 Bottle 3  . diclofenac sodium (VOLTAREN) 1 % GEL Apply 2 g topically 4 (four) times daily. 200 g 2  . phenazopyridine (PYRIDIUM) 97 MG tablet Take 2 tablets by mouth in the morning.    . Probiotic Product (PROBIOTIC-10 PO) Take by mouth.    . sildenafil (REVATIO) 20 MG tablet Take 3-5 tablets 45 minutes prior daily as needed. 50 tablet 1  . Vitamin D, Ergocalciferol, (DRISDOL) 1.25 MG (50000 UT) CAPS capsule Take 1 capsule (50,000 Units total) by mouth every 7 (seven) days. 8 capsule 0  . chlorthalidone (HYGROTON) 25 MG tablet Take 0.5 tablets (12.5 mg total) by mouth daily. 90 tablet 3  . metFORMIN (GLUCOPHAGE) 500 MG tablet TAKE 1 TABLET BY MOUTH ONCE DAILY WITH BREAKFAST 60 tablet 0  . predniSONE (STERAPRED UNI-PAK 48 TAB) 10 MG (48) TBPK tablet Pharm to instruct a 12 day dose pack. 48 tablet 0  . tamsulosin (FLOMAX) 0.4 MG CAPS capsule Take 1 capsule (0.4 mg total) by mouth daily. 90 capsule 1   No facility-administered medications prior to visit.     Allergies  Allergen Reactions  . Sulfa Antibiotics Rash     ROS Review of Systems  Constitutional: Negative for chills, diaphoresis, fatigue, fever and unexpected weight change.  HENT: Negative.   Eyes: Negative for photophobia and visual disturbance.  Respiratory: Negative.   Cardiovascular: Negative.   Gastrointestinal: Negative.   Endocrine: Negative for polyphagia and polyuria.  Genitourinary: Negative for difficulty urinating, frequency and urgency.  Musculoskeletal: Negative for gait problem and joint swelling.  Skin: Negative for pallor and rash.  Allergic/Immunologic: Negative for immunocompromised state.  Neurological: Negative for light-headedness and headaches.  Hematological: Does not bruise/bleed easily.  Psychiatric/Behavioral: Negative.       Objective:    Physical Exam  Constitutional: He is oriented to person, place, and time. He appears well-developed and well-nourished. No distress.  HENT:  Head: Normocephalic and atraumatic.  Right Ear: External ear normal.  Left Ear: External ear normal.  Mouth/Throat: Oropharynx is clear and moist. No oropharyngeal exudate.  Eyes: Pupils are equal, round, and reactive to light. Conjunctivae and EOM are normal. Right eye exhibits no discharge. Left eye exhibits no discharge. No scleral icterus.  Neck: Neck supple. No JVD present. No tracheal deviation present. No thyromegaly present.  Cardiovascular: Normal rate, regular rhythm and normal heart sounds.  Pulmonary/Chest: Effort normal and breath sounds normal. No stridor.  Abdominal: Bowel sounds are normal. He exhibits no distension. There is no abdominal tenderness. There is no rebound and no guarding.  Musculoskeletal:        General: Edema (trace) present.  Lymphadenopathy:    He has no cervical adenopathy.  Neurological: He is alert and oriented to person, place, and time.  Skin: Skin is warm and dry. He is not diaphoretic.  Psychiatric: He has a normal mood and affect. His behavior is normal.    BP 118/76   Pulse 99   Ht  6' (1.829 m)   Wt (!) 341 lb 8 oz (154.9 kg)   SpO2 95%   BMI 46.32 kg/m  Wt Readings from Last 3 Encounters:  02/07/19 (!) 341 lb 8 oz (154.9 kg)  10/05/18 (!) 324 lb (147 kg)  09/08/18 (!) 341 lb 12.8 oz (155 kg)   BP Readings from Last 3 Encounters:  02/07/19 118/76  10/05/18 125/75  09/08/18 (!) 124/100   Guideline developer:  UpToDate (see UpToDate for funding source) Date Released: June 2014  There are no preventive care reminders to display for this patient.  There are no preventive care reminders to display for this patient.  Lab Results  Component Value Date   TSH 2.180 04/21/2018   Lab Results  Component Value Date   WBC 6.0 04/21/2018   HGB 14.1 04/21/2018   HCT 41.7 04/21/2018   MCV 86 04/21/2018   PLT 276.0 12/21/2017   Lab Results  Component Value Date   NA 141 10/05/2018   K 3.9 10/05/2018   CO2 20 10/05/2018   GLUCOSE 96 10/05/2018   BUN 24 10/05/2018   CREATININE 0.89 10/05/2018   BILITOT 0.6 10/05/2018   ALKPHOS 114 10/05/2018   AST 9 10/05/2018   ALT 14 10/05/2018   PROT 7.2 10/05/2018   ALBUMIN 4.3 10/05/2018   CALCIUM 9.0 10/05/2018   GFR 65.40 08/09/2018   Lab Results  Component Value Date   CHOL 165 10/05/2018   Lab Results  Component Value Date   HDL 32 (L) 10/05/2018   Lab Results  Component Value Date   LDLCALC 105 (H) 10/05/2018   Lab Results  Component Value Date   TRIG 138 10/05/2018   Lab Results  Component Value Date   CHOLHDL 4 12/21/2017   Lab Results  Component Value Date   HGBA1C 5.1 10/05/2018      Assessment & Plan:   Problem List Items Addressed This Visit      Other   Benign prostatic hyperplasia with weak urinary stream   Relevant Medications   tamsulosin (FLOMAX) 0.4 MG CAPS capsule   Other Relevant Orders   PSA   Edema   Relevant Medications   chlorthalidone (HYGROTON) 25 MG tablet   Elevated BP without diagnosis of hypertension   Relevant Medications   chlorthalidone (HYGROTON) 25 MG  tablet   Other Relevant Orders   CBC   Comprehensive metabolic panel   Urinalysis, Routine w reflex microscopic   Vitamin D deficiency - Primary   Relevant Orders   VITAMIN D 25 Hydroxy (Vit-D Deficiency, Fractures)    Other Visit Diagnoses    Class 3 severe obesity with serious comorbidity and body mass index (BMI) of 40.0 to 44.9 in adult, unspecified obesity type (HCC)       Relevant Medications   metFORMIN (GLUCOPHAGE) 500 MG tablet   At risk for diabetes mellitus       Relevant Orders   CBC   Comprehensive metabolic panel   Hemoglobin A1c   Insulin resistance       Relevant Medications   metFORMIN (GLUCOPHAGE) 500 MG tablet   Encounter for health maintenance examination with abnormal findings          Meds ordered this encounter  Medications  . chlorthalidone (HYGROTON) 25 MG tablet    Sig: Take 0.5 tablets (12.5 mg total) by mouth daily.    Dispense:  90 tablet    Refill:  3  . metFORMIN (GLUCOPHAGE) 500 MG tablet    Sig: TAKE 1 TABLET BY MOUTH ONCE DAILY WITH BREAKFAST    Dispense:  90 tablet    Refill:  1  . tamsulosin (FLOMAX) 0.4 MG CAPS capsule    Sig: Take 1 capsule (0.4 mg total) by mouth daily.    Dispense:  90 capsule    Refill:  1    Follow-up: Return in about 6 months (around 08/10/2019).   Patient was given information on health maintenance and disease prevention.  He was also given information on the Mediterranean.  Encouraged him to stay active and follow-up with his dentist and eye doctor

## 2019-02-07 NOTE — Addendum Note (Signed)
Addended by: Jon Billings on: 02/07/2019 03:55 PM   Modules accepted: Orders

## 2019-02-07 NOTE — Patient Instructions (Signed)
Mediterranean Diet A Mediterranean diet refers to food and lifestyle choices that are based on the traditions of countries located on the [employer]. This way of eating has been shown to help prevent certain conditions and improve outcomes for people who have chronic diseases, like kidney disease and heart disease. What are tips for following this plan? Lifestyle  Cook and eat meals together with your family, when possible.  Drink enough fluid to keep your urine clear or pale yellow.  Be physically active every day. This includes: ? Aerobic exercise like running or swimming. ? Leisure activities like gardening, walking, or housework.  Get 7-8 hours of sleep each night.  If recommended by your health care provider, drink red wine in moderation. This means 1 glass a day for nonpregnant women and 2 glasses a day for men. A glass of wine equals 5 oz (150 mL). Reading food labels   Check the serving size of packaged foods. For foods such as rice and pasta, the serving size refers to the amount of cooked product, not dry.  Check the total fat in packaged foods. Avoid foods that have saturated fat or trans fats.  Check the ingredients list for added sugars, such as corn syrup. Shopping  At the grocery store, buy most of your food from the areas near the walls of the store. This includes: ? Fresh fruits and vegetables (produce). ? Grains, beans, nuts, and seeds. Some of these may be available in unpackaged forms or large amounts (in bulk). ? Fresh seafood. ? Poultry and eggs. ? Low-fat dairy products.  Buy whole ingredients instead of prepackaged foods.  Buy fresh fruits and vegetables in-season from local farmers markets.  Buy frozen fruits and vegetables in resealable bags.  If you do not have access to quality fresh seafood, buy precooked frozen shrimp or canned fish, such as tuna, salmon, or sardines.  Buy small amounts of raw or cooked vegetables, salads, or olives from  the deli or salad bar at your store.  Stock your pantry so you always have certain foods on hand, such as olive oil, canned tuna, canned tomatoes, rice, pasta, and beans. Cooking  Cook foods with extra-virgin olive oil instead of using butter or other vegetable oils.  Have meat as a side dish, and have vegetables or grains as your main dish. This means having meat in small portions or adding small amounts of meat to foods like pasta or stew.  Use beans or vegetables instead of meat in common dishes like chili or lasagna.  Experiment with different cooking methods. Try roasting or broiling vegetables instead of steaming or sauteing them.  Add frozen vegetables to soups, stews, pasta, or rice.  Add nuts or seeds for added healthy fat at each meal. You can add these to yogurt, salads, or vegetable dishes.  Marinate fish or vegetables using olive oil, lemon juice, garlic, and fresh herbs. Meal planning   Plan to eat 1 vegetarian meal one day each week. Try to work up to 2 vegetarian meals, if possible.  Eat seafood 2 or more times a week.  Have healthy snacks readily available, such as: ? Vegetable sticks with hummus. ? [place] yogurt. ? Fruit and nut trail mix.  Eat balanced meals throughout the week. This includes: ? Fruit: 2-3 servings a day ? Vegetables: 4-5 servings a day ? Low-fat dairy: 2 servings a day ? Fish, poultry, or lean meat: 1 serving a day ? Beans and legumes: 2 or more servings a week ?  Nuts and seeds: 1-2 servings a day ? Whole grains: 6-8 servings a day ? Extra-virgin olive oil: 3-4 servings a day  Limit red meat and sweets to only a few servings a month What are my food choices?  Mediterranean diet ? Recommended  Grains: Whole-grain pasta. Brown rice. Bulgar wheat. Polenta. Couscous. Whole-wheat bread. [place] Cobbatmeal. Quinoa.  Vegetables: Artichokes. Beets. Broccoli. Cabbage. Carrots. Eggplant. Green beans. Chard. Kale. Spinach. Onions. Leeks. Peas. Squash.  Tomatoes. Peppers. Radishes.  Fruits: Apples. Apricots. Avocado. Berries. Bananas. Cherries. Dates. Figs. Grapes. Lemons. Melon. Oranges. Peaches. Plums. Pomegranate.  Meats and other protein foods: Beans. Almonds. Sunflower seeds. Pine nuts. Peanuts. Cod. Salmon. Scallops. Shrimp. Tuna. Tilapia. Clams. Oysters. Eggs.  Dairy: Low-fat milk. Cheese. Greek yogurt.  Beverages: Water. Red wine. Herbal tea.  Fats and oils: Extra virgin olive oil. Avocado oil. Grape seed oil.  Sweets and desserts: [place] yogurt with honey. Baked apples. Poached pears. Trail mix.  Seasoning and other foods: Basil. Cilantro. Coriander. Cumin. Mint. Parsley. Sage. Rosemary. Tarragon. Garlic. Oregano. Thyme. Pepper. Balsalmic vinegar. Tahini. Hummus. Tomato sauce. Olives. Mushrooms. ? Limit these  Grains: Prepackaged pasta or rice dishes. Prepackaged cereal with added sugar.  Vegetables: Deep fried potatoes (french fries).  Fruits: Fruit canned in syrup.  Meats and other protein foods: Beef. Pork. Lamb. Poultry with skin. Hot dogs. Tomasa BlaseBacon.  Dairy: Ice cream. Sour cream. Whole milk.  Beverages: Juice. Sugar-sweetened soft drinks. Beer. Liquor and spirits.  Fats and oils: Butter. Canola oil. Vegetable oil. Beef fat (tallow). Lard.  Sweets and desserts: Cookies. Cakes. Pies. Candy.  Seasoning and other foods: Mayonnaise. Premade sauces and marinades. The items listed may not be a complete list. Talk with your dietitian about what dietary choices are right for you. Summary  The Mediterranean diet includes both food and lifestyle choices.  Eat a variety of fresh fruits and vegetables, beans, nuts, seeds, and whole grains.  Limit the amount of red meat and sweets that you eat.  Talk with your health care provider about whether it is safe for you to drink red wine in moderation. This means 1 glass a day for nonpregnant women and 2 glasses a day for men. A glass of wine equals 5 oz (150 mL). This information  is not intended to replace advice given to you by your health care provider. Make sure you discuss any questions you have with your health care provider. Document Released: 03/12/2016 Document Revised: 03/19/2016 Document Reviewed: 03/12/2016 Elsevier Patient Education  2020 [employer]  Health Maintenance, Male Adopting a healthy lifestyle and getting preventive care are important in promoting health and wellness. Ask your health care provider about:  The right schedule for you to have regular tests and exams.  Things you can do on your own to prevent diseases and keep yourself healthy. What should I know about diet, weight, and exercise? Eat a healthy diet   Eat a diet that includes plenty of vegetables, fruits, low-fat dairy products, and lean protein.  Do not eat a lot of foods that are high in solid fats, added sugars, or sodium. Maintain a healthy weight Body mass index (BMI) is a measurement that can be used to identify possible weight problems. It estimates body fat based on height and weight. Your health care provider can help determine your BMI and help you achieve or maintain a healthy weight. Get regular exercise Get regular exercise. This is one of the most important things you can do for your health. Most adults should:  Exercise  for at least 150 minutes each week. The exercise should increase your heart rate and make you sweat (moderate-intensity exercise).  Do strengthening exercises at least twice a week. This is in addition to the moderate-intensity exercise.  Spend less time sitting. Even light physical activity can be beneficial. Watch cholesterol and blood lipids Have your blood tested for lipids and cholesterol at 59 years of age, then have this test every 5 years. You may need to have your cholesterol levels checked more often if:  Your lipid or cholesterol levels are high.  You are older than 59 years of age.  You are at high risk for heart disease. What  should I know about cancer screening? Many types of cancers can be detected early and may often be prevented. Depending on your health history and family history, you may need to have cancer screening at various ages. This may include screening for:  Colorectal cancer.  Prostate cancer.  Skin cancer.  Lung cancer. What should I know about heart disease, diabetes, and high blood pressure? Blood pressure and heart disease  High blood pressure causes heart disease and increases the risk of stroke. This is more likely to develop in people who have high blood pressure readings, are of African descent, or are overweight.  Talk with your health care provider about your target blood pressure readings.  Have your blood pressure checked: ? Every 3-5 years if you are 19-9 years of age. ? Every year if you are 4 years old or older.  If you are between the ages of 58 and 48 and are a current or former smoker, ask your health care provider if you should have a one-time screening for abdominal aortic aneurysm (AAA). Diabetes Have regular diabetes screenings. This checks your fasting blood sugar level. Have the screening done:  Once every three years after age 79 if you are at a normal weight and have a low risk for diabetes.  More often and at a younger age if you are overweight or have a high risk for diabetes. What should I know about preventing infection? Hepatitis B If you have a higher risk for hepatitis B, you should be screened for this virus. Talk with your health care provider to find out if you are at risk for hepatitis B infection. Hepatitis C Blood testing is recommended for:  Everyone born from [birth year] through 1966.  Anyone with known risk factors for hepatitis C. Sexually transmitted infections (STIs)  You should be screened each year for STIs, including gonorrhea and chlamydia, if: ? You are sexually active and are younger than 59 years of age. ? You are older than 59 years of  age and your health care provider tells you that you are at risk for this type of infection. ? Your sexual activity has changed since you were last screened, and you are at increased risk for chlamydia or gonorrhea. Ask your health care provider if you are at risk.  Ask your health care provider about whether you are at high risk for HIV. Your health care provider may recommend a prescription medicine to help prevent HIV infection. If you choose to take medicine to prevent HIV, you should first get tested for HIV. You should then be tested every 3 months for as long as you are taking the medicine. Follow these instructions at home: Lifestyle  Do not use any products that contain nicotine or tobacco, such as cigarettes, e-cigarettes, and chewing tobacco. If you need help quitting, ask your  health care provider.  Do not use street drugs.  Do not share needles.  Ask your health care provider for help if you need support or information about quitting drugs. Alcohol use  Do not drink alcohol if your health care provider tells you not to drink.  If you drink alcohol: ? Limit how much you have to 0-2 drinks a day. ? Be aware of how much alcohol is in your drink. In the [place] one drink equals one 12 oz bottle of beer (355 mL), one 5 oz glass of wine (148 mL), or one 1 oz glass of hard liquor (44 mL). General instructions  Schedule regular health, dental, and eye exams.  Stay current with your vaccines.  Tell your health care provider if: ? You often feel depressed. ? You have ever been abused or do not feel safe at home. Summary  Adopting a healthy lifestyle and getting preventive care are important in promoting health and wellness.  Follow your health care provider's instructions about healthy diet, exercising, and getting tested or screened for diseases.  Follow your health care provider's instructions on monitoring your cholesterol and blood pressure. This information is not intended  to replace advice given to you by your health care provider. Make sure you discuss any questions you have with your health care provider. Document Released: 01/16/2008 Document Revised: 07/13/2018 Document Reviewed: 07/13/2018 Elsevier Patient Education  2020 [employer]

## 2019-02-08 ENCOUNTER — Other Ambulatory Visit (INDEPENDENT_AMBULATORY_CARE_PROVIDER_SITE_OTHER): Payer: No Typology Code available for payment source

## 2019-02-08 DIAGNOSIS — Z9189 Other specified personal risk factors, not elsewhere classified: Secondary | ICD-10-CM | POA: Insufficient documentation

## 2019-02-08 DIAGNOSIS — R6882 Decreased libido: Secondary | ICD-10-CM | POA: Insufficient documentation

## 2019-02-08 DIAGNOSIS — E8881 Metabolic syndrome: Secondary | ICD-10-CM | POA: Insufficient documentation

## 2019-02-08 DIAGNOSIS — E88819 Insulin resistance, unspecified: Secondary | ICD-10-CM | POA: Insufficient documentation

## 2019-02-08 LAB — TESTOSTERONE: Testosterone: 375.05 ng/dL (ref 300.00–890.00)

## 2019-02-08 NOTE — Addendum Note (Signed)
Addended by: Jon Billings on: 02/08/2019 09:09 AM   Modules accepted: Orders

## 2019-02-14 ENCOUNTER — Encounter: Payer: Self-pay | Admitting: Family Medicine

## 2019-02-14 DIAGNOSIS — E8881 Metabolic syndrome: Secondary | ICD-10-CM

## 2019-02-14 MED ORDER — METFORMIN HCL 500 MG PO TABS: ORAL_TABLET | ORAL | 1 refills | Status: DC

## 2019-02-14 MED FILL — metFORMIN HCL 500 MG TABS: 500 | 90 days supply | Qty: 90 | Fill #0

## 2019-02-14 MED FILL — CHLORTHALIDONE 25 MG TABLET: 25 | 90 days supply | Qty: 45 | Fill #1

## 2019-03-30 MED FILL — VIT D2 1.25 MG (50,000 UNIT: 1.25 MG | 56 days supply | Qty: 8 | Fill #1

## 2019-05-03 ENCOUNTER — Encounter: Payer: Self-pay | Admitting: Family Medicine

## 2019-05-03 DIAGNOSIS — N401 Enlarged prostate with lower urinary tract symptoms: Secondary | ICD-10-CM

## 2019-05-03 DIAGNOSIS — R3912 Poor urinary stream: Secondary | ICD-10-CM

## 2019-05-03 MED ORDER — TAMSULOSIN HCL 0.4 MG PO CAPS: 0.4000 mg | ORAL_CAPSULE | Freq: Every day | ORAL | 1 refills | Status: DC

## 2019-05-03 MED FILL — TAMSULOSIN HCL 0.4 MG CAP: 0.4 | 90 days supply | Qty: 90 | Fill #0

## 2019-05-17 MED FILL — metFORMIN HCL 500 MG TABS: 500 | 90 days supply | Qty: 90 | Fill #1

## 2019-05-17 MED FILL — CHLORTHALIDONE 25 MG TABS: 25 | 90 days supply | Qty: 45 | Fill #2

## 2019-05-25 MED FILL — VIT D2 1.25 MG (50,000 UNIT: 1.25 MG | 56 days supply | Qty: 8 | Fill #2

## 2019-07-20 MED FILL — VIT D2 1.25 MG (50,000 UNIT: 1.25 MG | 56 days supply | Qty: 8 | Fill #3

## 2019-08-07 MED FILL — TAMSULOSIN HCL 0.4 MG CAP: 0.4 | 90 days supply | Qty: 90 | Fill #1

## 2019-08-18 ENCOUNTER — Other Ambulatory Visit: Payer: Self-pay

## 2019-08-18 ENCOUNTER — Encounter: Payer: Self-pay | Admitting: Family Medicine

## 2019-08-18 DIAGNOSIS — R609 Edema, unspecified: Secondary | ICD-10-CM

## 2019-08-18 DIAGNOSIS — E8881 Metabolic syndrome: Secondary | ICD-10-CM

## 2019-08-18 DIAGNOSIS — R030 Elevated blood-pressure reading, without diagnosis of hypertension: Secondary | ICD-10-CM

## 2019-08-18 MED ORDER — CHLORTHALIDONE 25 MG PO TABS: 12.5000 mg | ORAL_TABLET | Freq: Every day | ORAL | 0 refills | Status: DC

## 2019-08-18 MED ORDER — METFORMIN HCL 500 MG PO TABS: ORAL_TABLET | ORAL | 0 refills | Status: DC

## 2019-08-18 MED FILL — CHLORTHALIDONE 25 MG TABS: 25 | 60 days supply | Qty: 30 | Fill #0

## 2019-08-18 MED FILL — metFORMIN HCL 500 MG TABS: 500 | 30 days supply | Qty: 30 | Fill #0

## 2019-08-18 NOTE — Addendum Note (Signed)
Addended by: Garfield Cornea L on: 08/18/2019 02:57 PM   Modules accepted: Orders

## 2019-10-20 ENCOUNTER — Other Ambulatory Visit: Payer: Self-pay

## 2019-10-23 ENCOUNTER — Other Ambulatory Visit: Payer: Self-pay

## 2019-10-23 ENCOUNTER — Other Ambulatory Visit: Payer: Self-pay | Admitting: Family Medicine

## 2019-10-23 ENCOUNTER — Encounter: Payer: Self-pay | Admitting: Family Medicine

## 2019-10-23 ENCOUNTER — Ambulatory Visit (INDEPENDENT_AMBULATORY_CARE_PROVIDER_SITE_OTHER): Payer: No Typology Code available for payment source | Admitting: Family Medicine

## 2019-10-23 VITALS — BP 128/70 | HR 72 | Temp 96.0°F | Ht 72.0 in | Wt 354.6 lb

## 2019-10-23 DIAGNOSIS — R030 Elevated blood-pressure reading, without diagnosis of hypertension: Secondary | ICD-10-CM

## 2019-10-23 DIAGNOSIS — Z0000 Encounter for general adult medical examination without abnormal findings: Secondary | ICD-10-CM | POA: Diagnosis not present

## 2019-10-23 DIAGNOSIS — R609 Edema, unspecified: Secondary | ICD-10-CM | POA: Diagnosis not present

## 2019-10-23 DIAGNOSIS — E559 Vitamin D deficiency, unspecified: Secondary | ICD-10-CM

## 2019-10-23 DIAGNOSIS — Z6841 Body Mass Index (BMI) 40.0 and over, adult: Secondary | ICD-10-CM

## 2019-10-23 DIAGNOSIS — E8881 Metabolic syndrome: Secondary | ICD-10-CM

## 2019-10-23 DIAGNOSIS — R3912 Poor urinary stream: Secondary | ICD-10-CM

## 2019-10-23 DIAGNOSIS — N401 Enlarged prostate with lower urinary tract symptoms: Secondary | ICD-10-CM

## 2019-10-23 LAB — CBC
HCT: 43 % (ref 39.0–52.0)
Hemoglobin: 14.7 g/dL (ref 13.0–17.0)
MCHC: 34.2 g/dL (ref 30.0–36.0)
MCV: 85.4 fl (ref 78.0–100.0)
Platelets: 259 10*3/uL (ref 150.0–400.0)
RBC: 5.04 Mil/uL (ref 4.22–5.81)
RDW: 14.2 % (ref 11.5–15.5)
WBC: 7.7 10*3/uL (ref 4.0–10.5)

## 2019-10-23 LAB — COMPREHENSIVE METABOLIC PANEL
ALT: 16 U/L (ref 0–53)
AST: 12 U/L (ref 0–37)
Albumin: 4 g/dL (ref 3.5–5.2)
Alkaline Phosphatase: 102 U/L (ref 39–117)
BUN: 23 mg/dL (ref 6–23)
CO2: 30 mEq/L (ref 19–32)
Calcium: 9.1 mg/dL (ref 8.4–10.5)
Chloride: 104 mEq/L (ref 96–112)
Creatinine, Ser: 1.11 mg/dL (ref 0.40–1.50)
GFR: 67.69 mL/min (ref >60.00)
Glucose, Bld: 102 mg/dL — ABNORMAL HIGH (ref 70–99)
Potassium: 4 mEq/L (ref 3.5–5.1)
Sodium: 139 mEq/L (ref 135–145)
Total Bilirubin: 0.5 mg/dL (ref 0.2–1.2)
Total Protein: 7.1 g/dL (ref 6.0–8.3)

## 2019-10-23 LAB — LIPID PANEL
Cholesterol: 162 mg/dL (ref 0–200)
HDL: 35.5 mg/dL — ABNORMAL LOW (ref >39.00)
NonHDL: 126.52
Total CHOL/HDL Ratio: 5
Triglycerides: 232 mg/dL — ABNORMAL HIGH (ref 0.0–149.0)
VLDL: 46.4 mg/dL — ABNORMAL HIGH (ref 0.0–40.0)

## 2019-10-23 LAB — LDL CHOLESTEROL, DIRECT: Direct LDL: 102 mg/dL

## 2019-10-23 LAB — VITAMIN D 25 HYDROXY (VIT D DEFICIENCY, FRACTURES): VITD: 26.49 ng/mL — ABNORMAL LOW (ref 30.00–100.00)

## 2019-10-23 LAB — HEMOGLOBIN A1C: Hgb A1c MFr Bld: 5.1 % (ref 4.6–6.5)

## 2019-10-23 LAB — PSA: PSA: 0.78 ng/mL (ref 0.10–4.00)

## 2019-10-23 MED ORDER — METFORMIN HCL 500 MG PO TABS: ORAL_TABLET | ORAL | 1 refills | Status: DC

## 2019-10-23 MED ORDER — CHLORTHALIDONE 25 MG PO TABS: 12.5000 mg | ORAL_TABLET | Freq: Every day | ORAL | 1 refills | Status: DC

## 2019-10-23 MED FILL — CHLORTHALIDONE 25 MG TABS: 25 | 90 days supply | Qty: 45 | Fill #0

## 2019-10-23 MED FILL — METFORMIN HCL 500 MG TABS: 500 | 90 days supply | Qty: 90 | Fill #0

## 2019-10-23 NOTE — Addendum Note (Signed)
Addended by: Varney Biles on: 10/23/2019 04:04 PM   Modules accepted: Orders

## 2019-10-23 NOTE — Progress Notes (Signed)
Established Patient Office Visit  Subjective:  Patient ID: Justin Bowen, male    DOB: 01/28/1961  Age: 59 y.o. MRN: [id number]  CC:  Chief Complaint  Patient presents with  . Follow-up    refill/follow up on medications     HPI Theophilus Walz presents for a complete physical exam and follow-up of his morbid obesity, health maintenance, peripheral edema, insulin resistance, BPH with weak urinary stream and vitamin D deficiency.  Patient will receive his second Covid vaccine on April 1.  He is not seen the dentist out of Covid fears.  He is not interested in having a colonoscopy.  He is staying busy with remodeling projects around his home, he tells me.  Having no issues taking his medicines.  Reiterates how helpful the Flomax has been for him.  Past Medical History:  Diagnosis Date  . BPH (benign prostatic hyperplasia)   . Chicken pox   . Fatigue   . Heartburn   . Knee pain   . Leg edema     Past Surgical History:  Procedure Laterality Date  . URETHRA SURGERY      Family History  Problem Relation Age of Onset  . Stroke Father 22  . Cancer Brother        brain cancer   . Cancer Paternal Grandmother 71       pancreatic cancer     Social History   Socioeconomic History  . Marital status: Married    Spouse name: Herold Salguero  . Number of children: Not on file  . Years of education: Not on file  . Highest education level: Not on file  Occupational History  . Not on file  Tobacco Use  . Smoking status: Never Smoker  . Smokeless tobacco: Never Used  Substance and Sexual Activity  . Alcohol use: Never  . Drug use: Never  . Sexual activity: Not on file  Other Topics Concern  . 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 Difficulty of Paying Living Expenses:   Food Insecurity:   . Worried About Programme researcher, broadcasting/film/video in the Last Year:   . Engineer, site in the Last Year:   Transportation Needs:   . Freight forwarder (Medical):   Marland Kitchen Lack of Transportation (Non-Medical):   Physical Activity:   . Days of Exercise per Week:   . Minutes of Exercise per Session:   Stress:   . Feeling of Stress :   Social Connections:   . Frequency of Communication with Friends and Family:   . Frequency of Social Gatherings with Friends and Family:   . Attends Religious Services:   . Active Member of Clubs or Organizations:   . Attends Banker Meetings:   Marland Kitchen Marital Status:   Intimate Partner Violence:   . Fear of Current or Ex-Partner:   . Emotionally Abused:   Marland Kitchen Physically Abused:   . Sexually Abused:     Outpatient Medications Prior to Visit  Medication Sig Dispense Refill  . chlorthalidone (HYGROTON) 25 MG tablet Take 0.5 tablets (12.5 mg total) by mouth daily. 30 tablet 0  . CRANBERRY EXTRACT PO Take by mouth.    . Cyanocobalamin (B-12 PO) Take by mouth.    . metFORMIN (GLUCOPHAGE) 500 MG tablet TAKE 1 TABLET BY MOUTH ONCE DAILY WITH  BREAKFAST 30 tablet 0  . sildenafil (REVATIO) 20 MG tablet Take 3-5 tablets 45 minutes prior daily as needed. 50 tablet 1  . tamsulosin (FLOMAX) 0.4 MG CAPS capsule Take 1 capsule (0.4 mg total) by mouth daily. 90 capsule 1  . Vitamin D, Ergocalciferol, (DRISDOL) 1.25 MG (50000 UT) CAPS capsule Take 1 capsule (50,000 Units total) by mouth every 7 (seven) days. 8 capsule 3  . Diclofenac Sodium (PENNSAID) 2 % SOLN Place 1 application onto the skin 2 (two) times daily. (Patient not taking: Reported on 10/23/2019) 1 Bottle 3  . diclofenac sodium (VOLTAREN) 1 % GEL Apply 2 g topically 4 (four) times daily. (Patient not taking: Reported on 10/23/2019) 200 g 2  . phenazopyridine (PYRIDIUM) 97 MG tablet Take 2 tablets by mouth in the morning.    . Probiotic Product (PROBIOTIC-10 PO) Take by mouth.     No facility-administered medications prior to visit.    Allergies  Allergen Reactions  . Sulfa  Antibiotics Rash    ROS Review of Systems  Constitutional: Negative.   HENT: Negative.   Eyes: Negative for photophobia and visual disturbance.  Respiratory: Negative.   Cardiovascular: Negative.   Gastrointestinal: Negative.   Endocrine: Negative for polyphagia and polyuria.  Genitourinary: Negative.   Musculoskeletal: Negative for gait problem and joint swelling.  Skin: Negative for pallor.  Allergic/Immunologic: Negative for immunocompromised state.  Neurological: Negative for tremors and speech difficulty.  Hematological: Does not bruise/bleed easily.  Psychiatric/Behavioral: Negative.       Objective:    Physical Exam  Constitutional: He appears well-developed and well-nourished. No distress.  HENT:  Head: Normocephalic and atraumatic.  Right Ear: External ear normal.  Left Ear: External ear normal.  Eyes: Conjunctivae are normal. Right eye exhibits no discharge. Left eye exhibits no discharge. No scleral icterus.  Neck: No JVD present. No tracheal deviation present. No thyromegaly present.  Cardiovascular: Normal rate, regular rhythm and normal heart sounds.  Pulmonary/Chest: Effort normal and breath sounds normal. No stridor.  Abdominal: Bowel sounds are normal. He exhibits no distension. There is no abdominal tenderness. There is no rebound and no guarding.  Genitourinary: Rectum:     Guaiac result negative.     No rectal mass, anal fissure, tenderness, external hemorrhoid, internal hemorrhoid or abnormal anal tone.  Prostate is not enlarged and not tender.  Lymphadenopathy:    He has no cervical adenopathy.  Neurological: He is alert.  Skin: Skin is warm and dry. He is not diaphoretic.  Psychiatric: He has a normal mood and affect. His behavior is normal.    BP 128/70   Pulse 72   Temp (!) 96 F (35.6 C) (Tympanic)   Ht 6' (1.829 m)   Wt (!) 354 lb 9.6 oz (160.8 kg)   SpO2 96%   BMI 48.09 kg/m  Wt Readings from Last 3 Encounters:  10/23/19 (!) 354 lb  9.6 oz (160.8 kg)  02/07/19 (!) 341 lb 8 oz (154.9 kg)  10/05/18 (!) 324 lb (147 kg)     Health Maintenance Due  Topic Date Due  . COLONOSCOPY  Never done    There are no preventive care reminders to display for this patient.  Lab Results  Component Value Date   TSH 2.180 04/21/2018   Lab Results  Component Value Date   WBC 7.8 02/07/2019   HGB 14.5 02/07/2019   HCT 42.3 02/07/2019   MCV 84.4 02/07/2019   PLT 287.0 02/07/2019   Lab Results  Component Value  Date   NA 140 02/07/2019   K 4.1 02/07/2019   CO2 26 02/07/2019   GLUCOSE 97 02/07/2019   BUN 22 02/07/2019   CREATININE 1.35 02/07/2019   BILITOT 0.7 02/07/2019   ALKPHOS 104 02/07/2019   AST 12 02/07/2019   ALT 14 02/07/2019   PROT 7.1 02/07/2019   ALBUMIN 4.0 02/07/2019   CALCIUM 8.7 02/07/2019   GFR 54.14 (L) 02/07/2019   Lab Results  Component Value Date   CHOL 165 10/05/2018   Lab Results  Component Value Date   HDL 32 (L) 10/05/2018   Lab Results  Component Value Date   LDLCALC 105 (H) 10/05/2018   Lab Results  Component Value Date   TRIG 138 10/05/2018   Lab Results  Component Value Date   CHOLHDL 4 12/21/2017   Lab Results  Component Value Date   HGBA1C 5.1 02/07/2019      Assessment & Plan:   Problem List Items Addressed This Visit      Other   Class 3 severe obesity with serious comorbidity and body mass index (BMI) of 40.0 to 44.9 in adult [facility])   Relevant Orders   Comprehensive metabolic panel   Hemoglobin A1c   Healthcare maintenance - Primary   Relevant Orders   CBC   LDL cholesterol, direct   Lipid panel   PSA   Urinalysis, Routine w reflex microscopic   Edema   Relevant Orders   Comprehensive metabolic panel   Urinalysis, Routine w reflex microscopic   Vitamin D deficiency   Relevant Orders   Comprehensive metabolic panel   VITAMIN D 25 Hydroxy (Vit-D Deficiency, Fractures)      No orders of the defined types were placed in this  encounter.   Follow-up: No follow-ups on file.  Patient was given information on the Cologuard.  He knows that he will have to contact his insurance company to getting approval for the test.  We discussed referral to bariatric surgery patient is reluctant to go at this point.  He was given information on bariatric surgery.  Also given information on health maintenance and disease prevention.  Advised patient that he is unlikely to transmit Covid to his immunocompromised granddaughter after seeing the dentist especially after he receives his second Covid vaccine.  Libby Maw, MD

## 2019-10-24 MED FILL — VIT D2 1.25 MG (50,000 UNIT: 1.25 MG | 56 days supply | Qty: 8 | Fill #0

## 2019-11-10 ENCOUNTER — Other Ambulatory Visit: Payer: Self-pay

## 2019-11-10 ENCOUNTER — Encounter: Payer: Self-pay | Admitting: Family Medicine

## 2019-11-10 DIAGNOSIS — R3912 Poor urinary stream: Secondary | ICD-10-CM

## 2019-11-10 DIAGNOSIS — N401 Enlarged prostate with lower urinary tract symptoms: Secondary | ICD-10-CM

## 2019-11-10 MED ORDER — TAMSULOSIN HCL 0.4 MG PO CAPS: 0.4000 mg | ORAL_CAPSULE | Freq: Every day | ORAL | 1 refills | Status: DC

## 2019-11-10 MED FILL — TAMSULOSIN HCL 0.4 MG CAP: 0.4 | 90 days supply | Qty: 90 | Fill #0

## 2019-12-29 MED FILL — VIT D2 1.25 MG (50,000 UNIT: 1.25 MG | 56 days supply | Qty: 8 | Fill #1

## 2020-01-24 MED FILL — METFORMIN HCL 500 MG TABS: 500 | 90 days supply | Qty: 90 | Fill #1

## 2020-01-24 MED FILL — CHLORTHALIDONE 25 MG TABS: 25 | 90 days supply | Qty: 45 | Fill #1

## 2020-02-16 MED FILL — TAMSULOSIN HCL 0.4 MG CAP: 0.4 | 90 days supply | Qty: 90 | Fill #1

## 2020-02-16 MED FILL — VIT D2 1.25 MG (50,000 UNIT: 1.25 MG | 56 days supply | Qty: 8 | Fill #2

## 2020-02-19 ENCOUNTER — Encounter: Payer: Self-pay | Admitting: Family Medicine

## 2020-02-19 NOTE — Telephone Encounter (Signed)
Sorry to hear that news. Justin Bowen, you should be able to call him and make an appointment. If you have any trouble, let me know.

## 2020-02-27 ENCOUNTER — Encounter: Payer: Self-pay | Admitting: Family Medicine

## 2020-02-27 ENCOUNTER — Other Ambulatory Visit: Payer: Self-pay

## 2020-02-27 ENCOUNTER — Ambulatory Visit (INDEPENDENT_AMBULATORY_CARE_PROVIDER_SITE_OTHER): Payer: No Typology Code available for payment source | Admitting: Family Medicine

## 2020-02-27 DIAGNOSIS — S93602D Unspecified sprain of left foot, subsequent encounter: Secondary | ICD-10-CM | POA: Diagnosis not present

## 2020-02-27 NOTE — Assessment & Plan Note (Signed)
Acute on chronic in nature.  Pain is worse when he is active and on his feet.  Does not appear to be a stress fracture.  May have a component of metatarsalgia versus Morton's neuroma.  Tends to supinate which may be excessive pressure on the lateral foot. -Counseled on home exercise therapy and supportive care. -Orthotics. -May need to consider lateral wedge.

## 2020-02-27 NOTE — Progress Notes (Signed)
Justin Bowen - 59 y.o. male MRN [id number]  Date of birth: May 13, 1961  SUBJECTIVE:  Including CC & ROS.  Chief Complaint  Patient presents with  . Foot Orthotics    Justin Bowen is a 59 y.o. male that is presenting with left foot pain. His pain is on the lateral aspect and dorsal sided.  Is acute on chronic in nature.  Denies any specific inciting event.  It is worse when he is on his feet or climbing Up down ladders.   Review of Systems See HPI   HISTORY: Past Medical, Surgical, Social, and Family History Reviewed & Updated per EMR.   Pertinent Historical Findings include:  Past Medical History:  Diagnosis Date  . BPH (benign prostatic hyperplasia)   . Chicken pox   . Fatigue   . Heartburn   . Knee pain   . Leg edema     Past Surgical History:  Procedure Laterality Date  . URETHRA SURGERY      Family History  Problem Relation Age of Onset  . Stroke Father 80  . Cancer Brother        brain cancer   . Cancer Paternal Grandmother 60       pancreatic cancer     Social History   Socioeconomic History  . Marital status: Married    Spouse name: Covey Baller  . Number of children: Not on file  . Years of education: Not on file  . Highest education level: Not on file  Occupational History  . Not on file  Tobacco Use  . Smoking status: Never Smoker  . Smokeless tobacco: Never Used  Vaping Use  . Vaping Use: Never used  Substance and Sexual Activity  . Alcohol use: Never  . Drug use: Never  . Sexual activity: Not on file  Other Topics Concern  . 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 Difficulty of Paying Living Expenses:   Food Insecurity:   . Worried About Programme researcher, broadcasting/film/video in the Last Year:   . Barista in the Last Year:   Transportation Needs:   . Freight forwarder (Medical):   Marland Kitchen Lack of Transportation (Non-Medical):    Physical Activity:   . Days of Exercise per Week:   . Minutes of Exercise per Session:   Stress:   . Feeling of Stress :   Social Connections:   . Frequency of Communication with Friends and Family:   . Frequency of Social Gatherings with Friends and Family:   . Attends Religious Services:   . Active Member of Clubs or Organizations:   . Attends Banker Meetings:   Marland Kitchen Marital Status:   Intimate Partner Violence:   . Fear of Current or Ex-Partner:   . Emotionally Abused:   Marland Kitchen Physically Abused:   . Sexually Abused:      PHYSICAL EXAM:  VS: Ht 6' (1.829 m)   Wt (!) 340 lb (154.2 kg)   BMI 46.11 kg/m  Physical Exam Gen: NAD, alert, cooperative with exam, well-appearing MSK:  Right and left foot. No obvious ecchymosis or swelling. Tenderness to palpation over the fourth and fifth metatarsals. Neurovascularly intact  Patient was fitted for a standard, cushioned, semi-rigid orthotic. The orthotic was heated and afterward the patient stood on the orthotic blank positioned on the  orthotic stand. The patient was positioned in subtalar neutral position and 10 degrees of ankle dorsiflexion in a weight bearing stance. After completion of molding, a stable base was applied to the orthotic blank. The blank was ground to a stable position for weight bearing. Size: 11 Pairs: 2 Base: Blue EVA Additional Posting and Padding: None The patient ambulated these, and they were very comfortable.   ASSESSMENT & PLAN:   Foot sprain, left, subsequent encounter Acute on chronic in nature.  Pain is worse when he is active and on his feet.  Does not appear to be a stress fracture.  May have a component of metatarsalgia versus Morton's neuroma.  Tends to supinate which may be excessive pressure on the lateral foot. -Counseled on home exercise therapy and supportive care. -Orthotics. -May need to consider lateral wedge.

## 2020-02-27 NOTE — Patient Instructions (Signed)
Good to see you Please try voltaren over the counter   Please send me a message in MyChart with any questions or updates.  Please see [place] back in a month or so or give [place] a call .   --Dr. Jordan Likes

## 2020-02-29 ENCOUNTER — Encounter: Payer: Self-pay | Admitting: Family Medicine

## 2020-03-05 ENCOUNTER — Other Ambulatory Visit: Payer: Self-pay

## 2020-03-06 ENCOUNTER — Ambulatory Visit: Payer: No Typology Code available for payment source | Admitting: Family Medicine

## 2020-03-06 IMAGING — DX DG KNEE COMPLETE 4+V*L*
4 series · 4 of 4 positions shown · non-contrast
Comparison: None.

CLINICAL DATA: Initial evaluation for chronic left knee pain.

EXAM:
LEFT KNEE - COMPLETE 4+ VIEW

[knee ap]
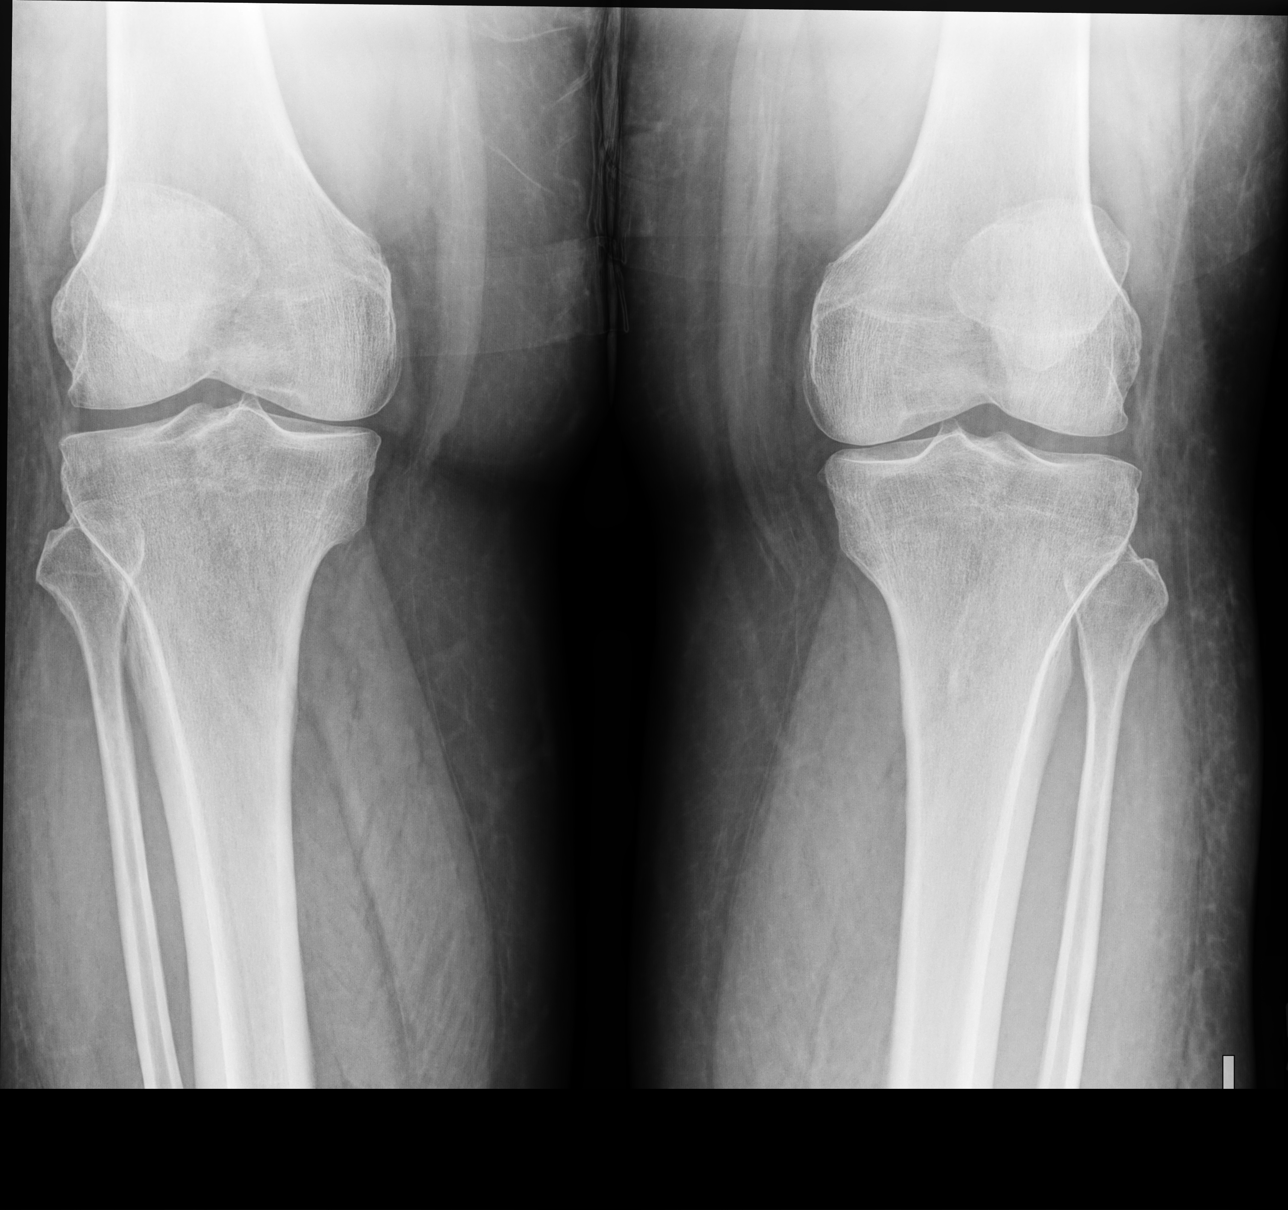

[knee [person_name] view pa]
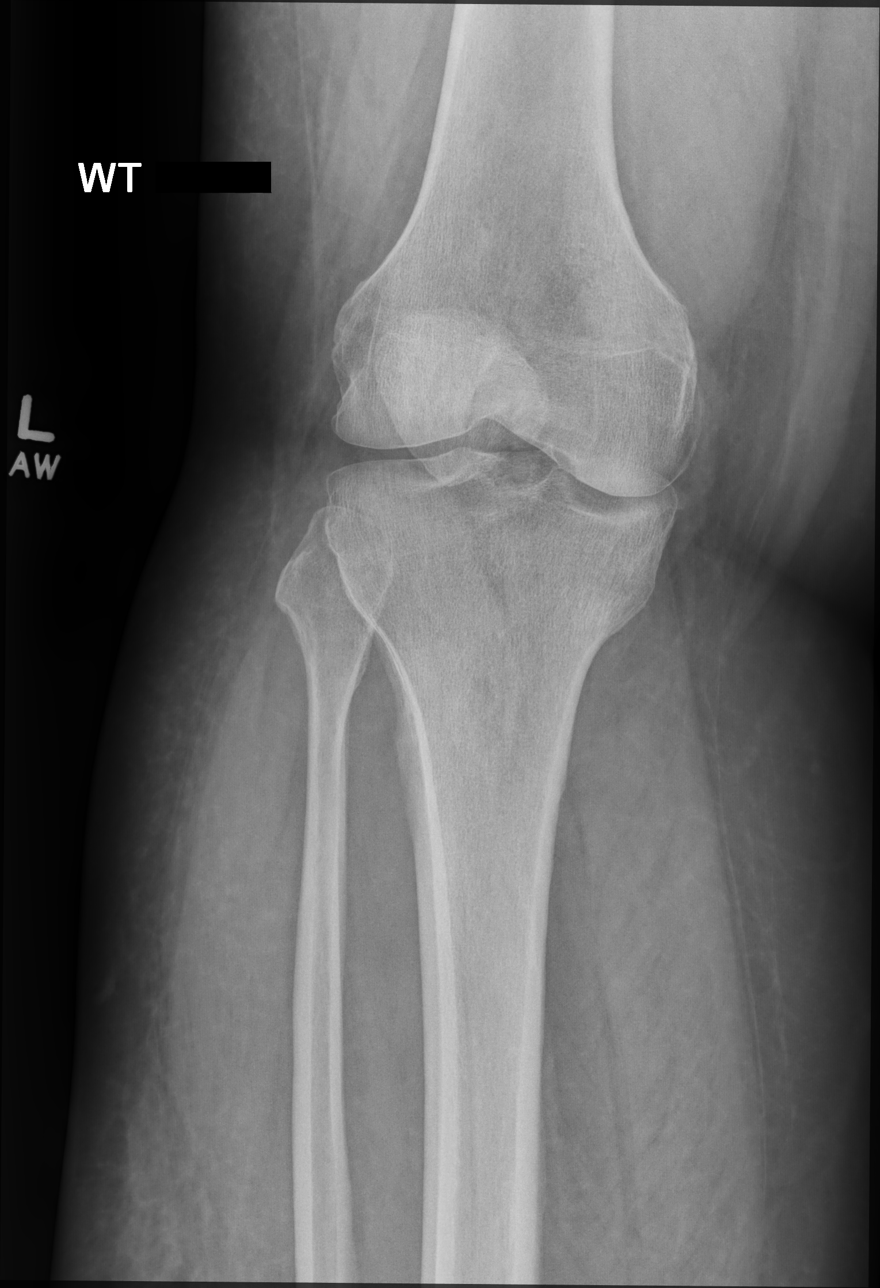

[knee lat]
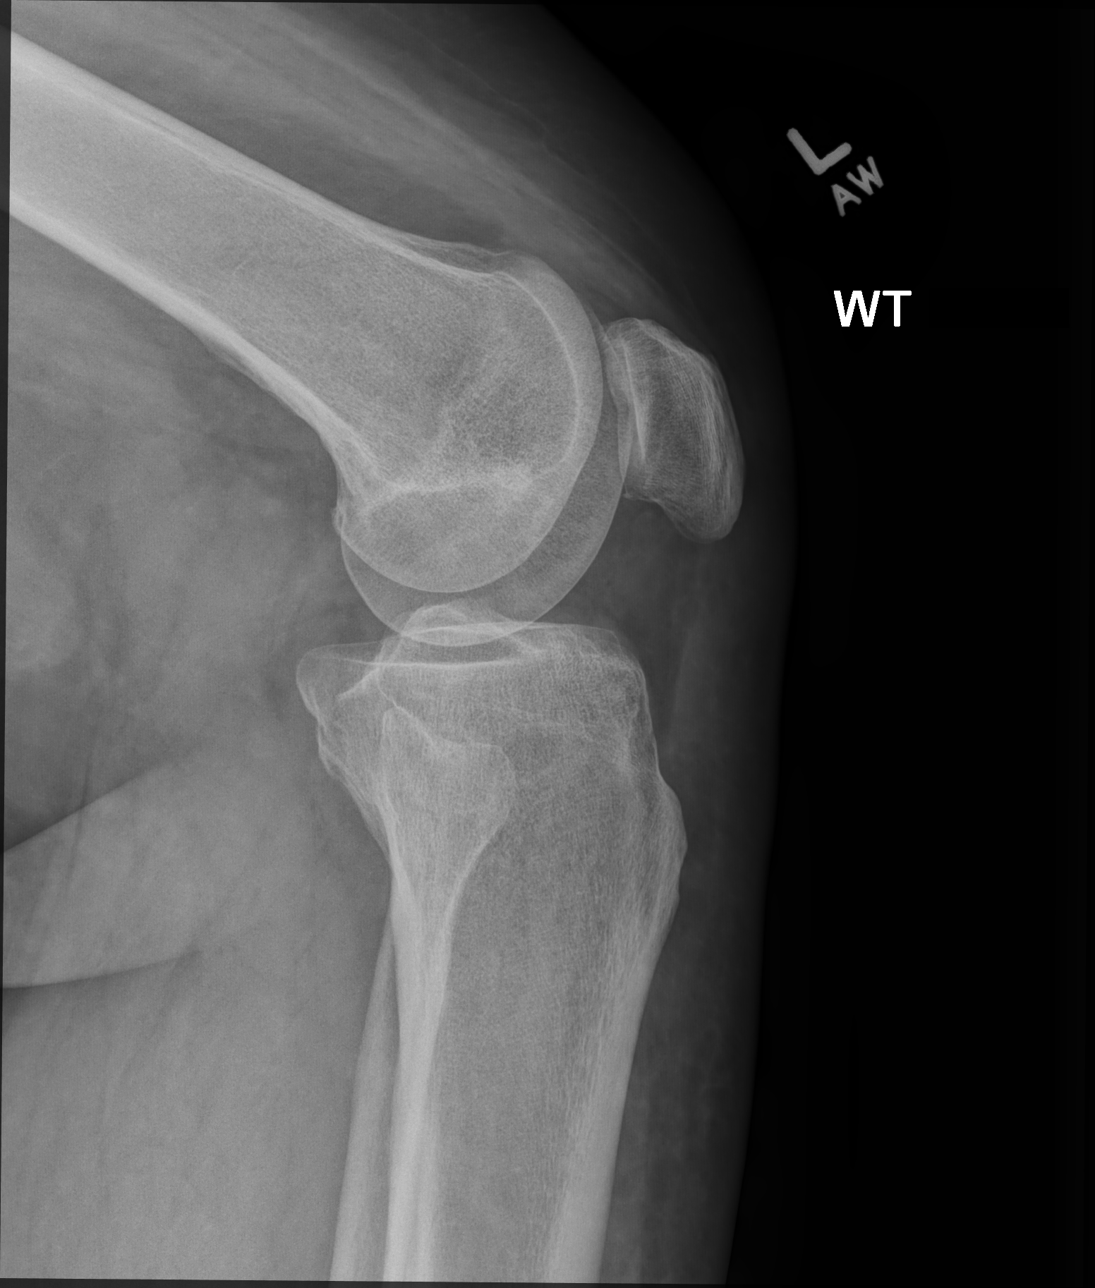

[patella (sunrise)]
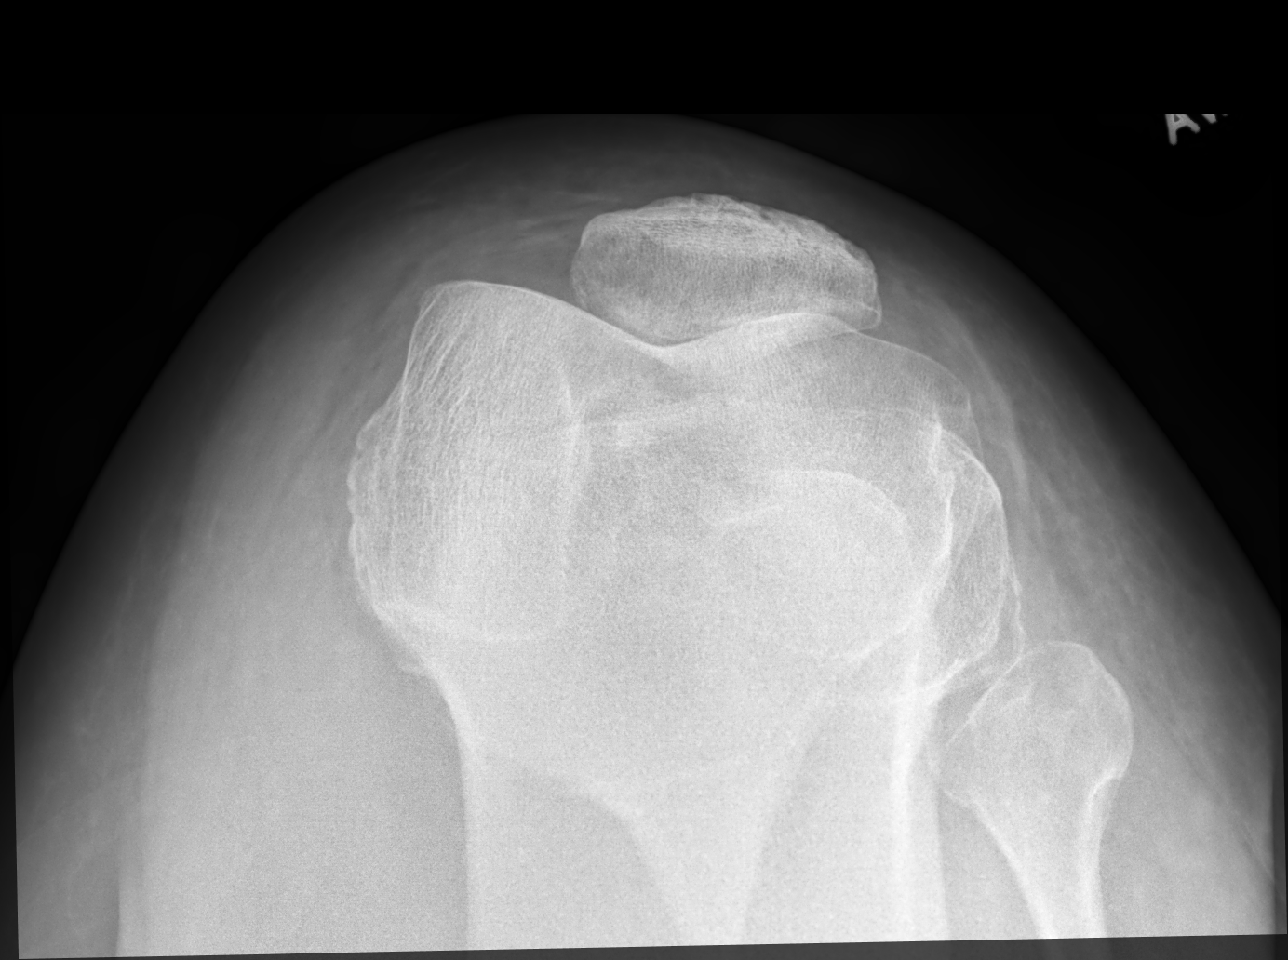

[4 of 4 positions shown; findings below may reference images not displayed]

FINDINGS: No acute fracture or dislocation. No joint effusion. No evidence for
remotely healed trauma. Mild degenerative osteoarthritic changes
about the left knee, slightly worse as compared to the contralateral
right. Osseous mineralization normal. No focal osseous lesions. No
soft tissue abnormality.
IMPRESSION: 1. Mild degenerative osteoarthritic changes about the knee.
2. No acute osseous abnormality.

## 2020-03-06 IMAGING — DX DG KNEE COMPLETE 4+V*R*
4 series · 4 of 4 positions shown · non-contrast
Comparison: None.

CLINICAL DATA: Initial evaluation for chronic right knee pain.

EXAM:
RIGHT KNEE - COMPLETE 4+ VIEW

[knee ap]
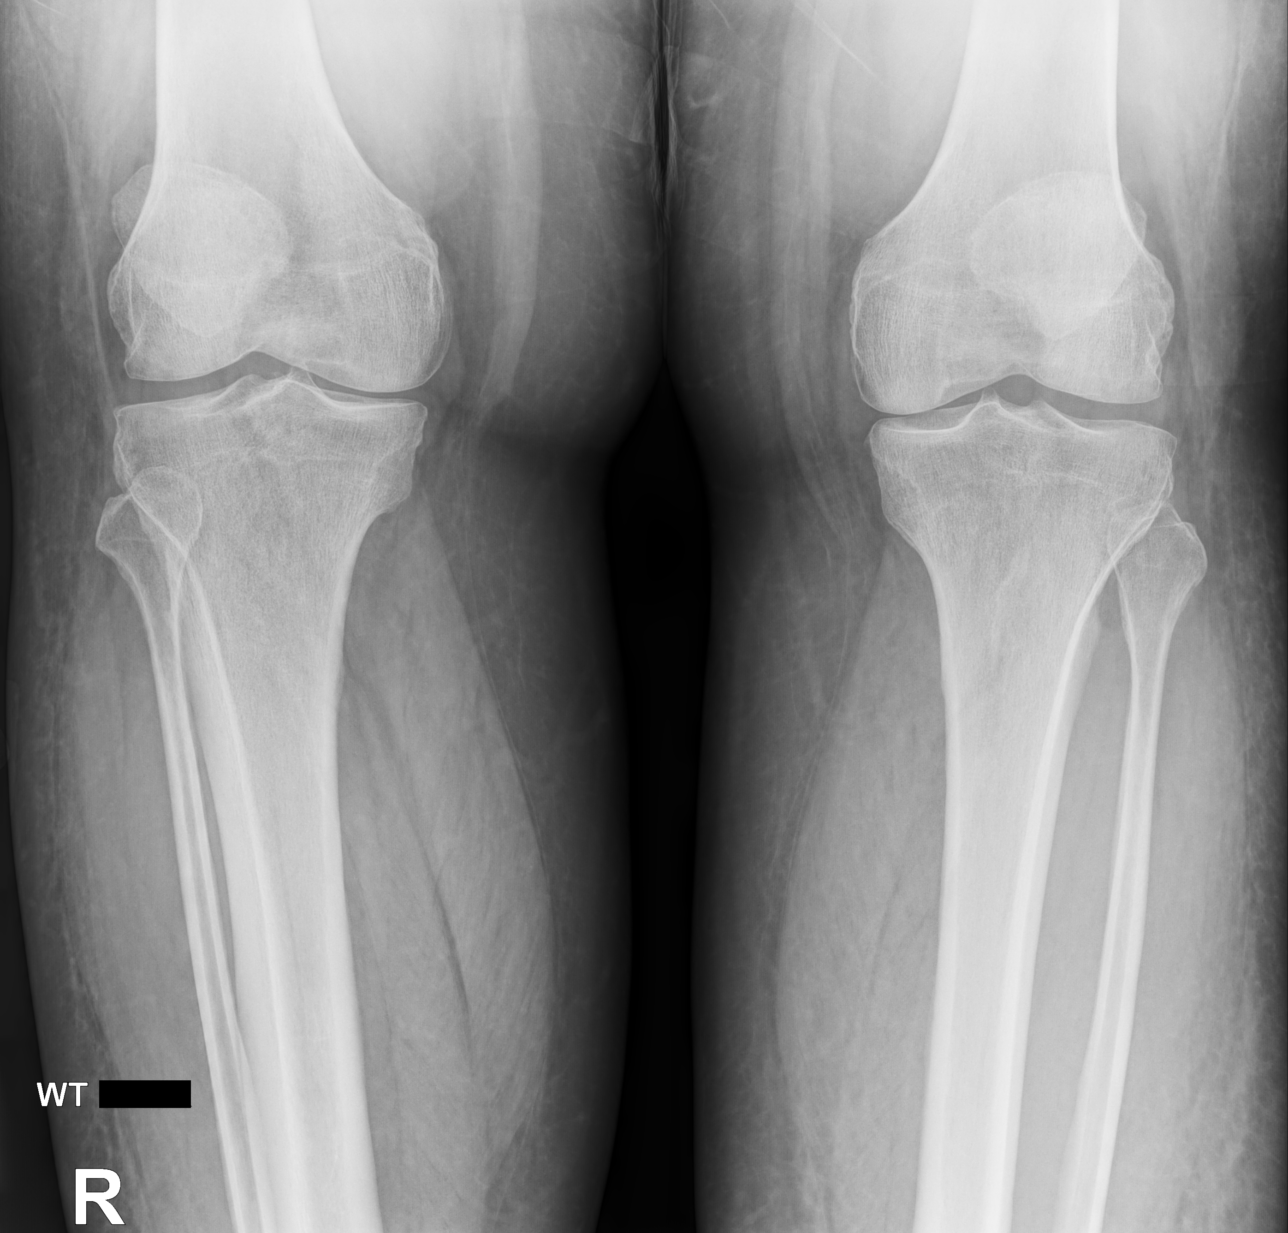

[knee [person_name] view pa]
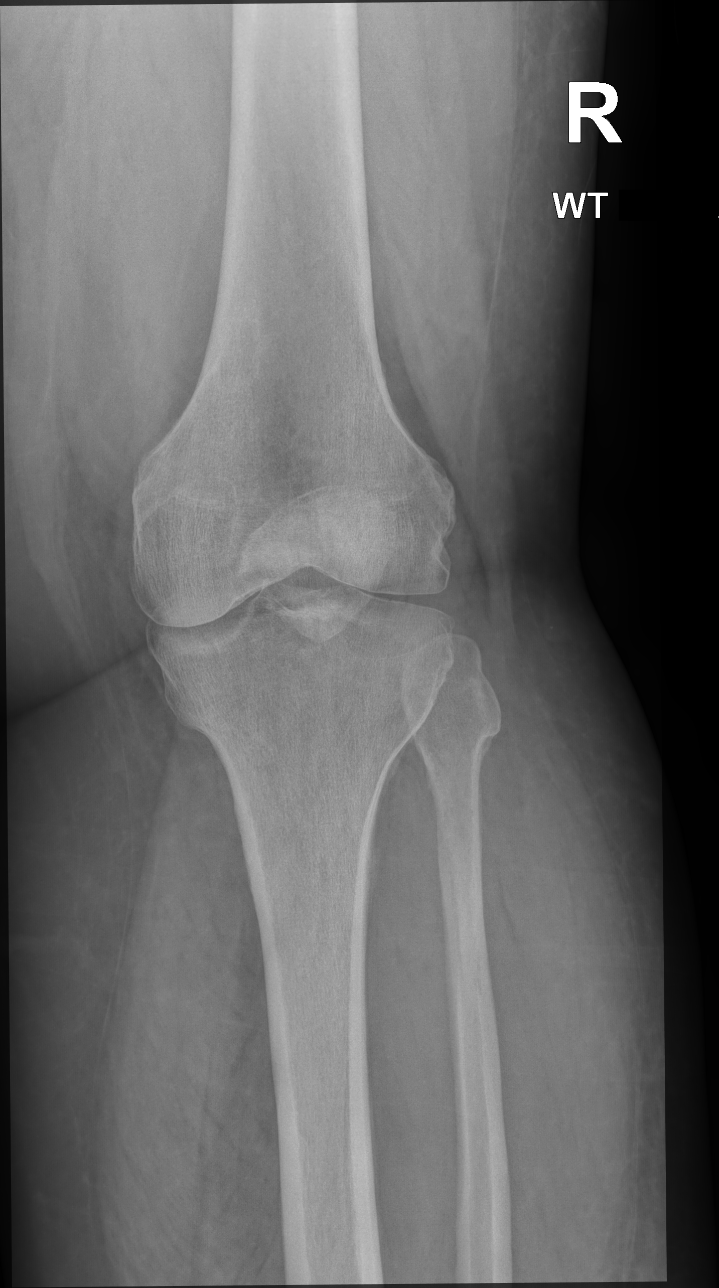

[knee lat]
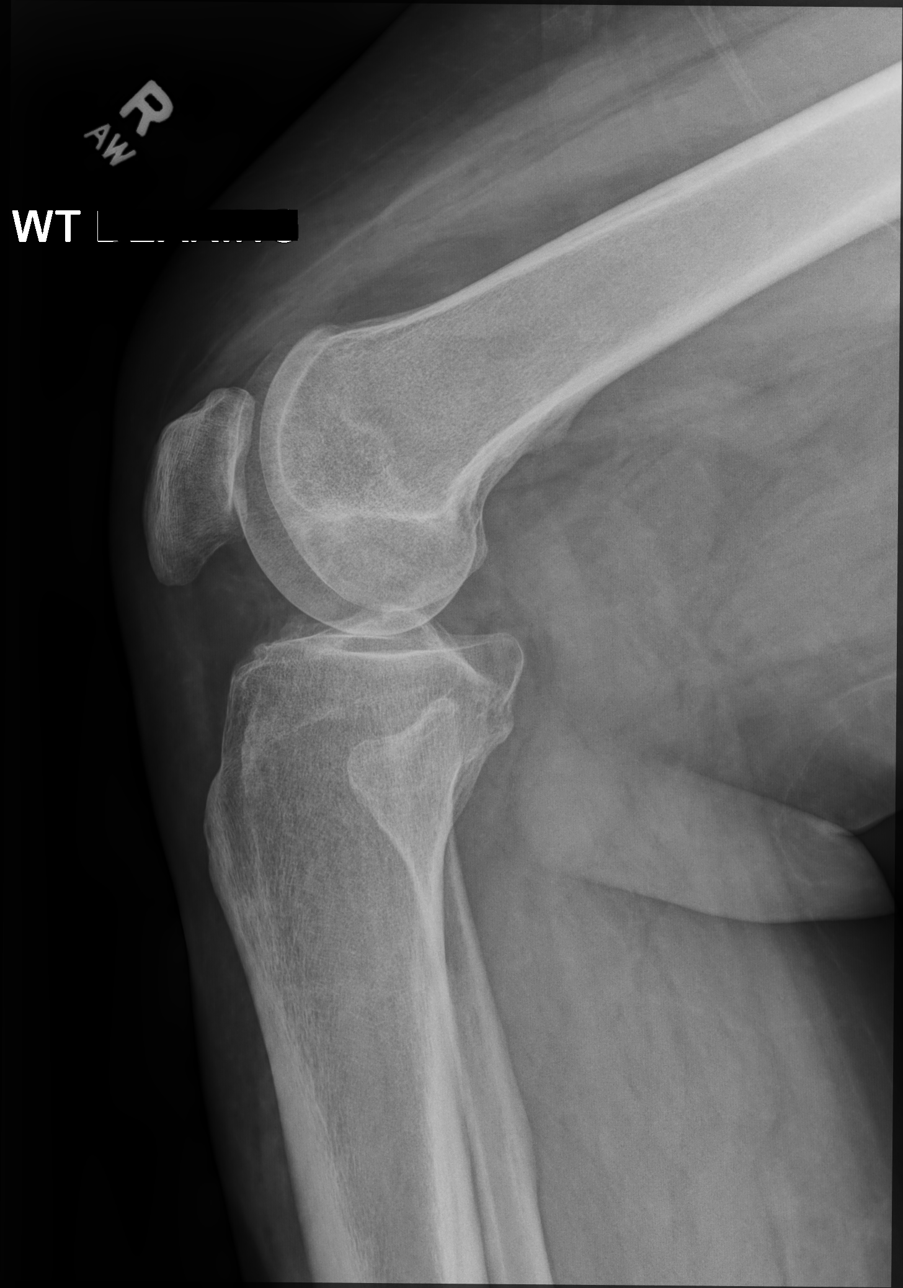

[patella (sunrise)]
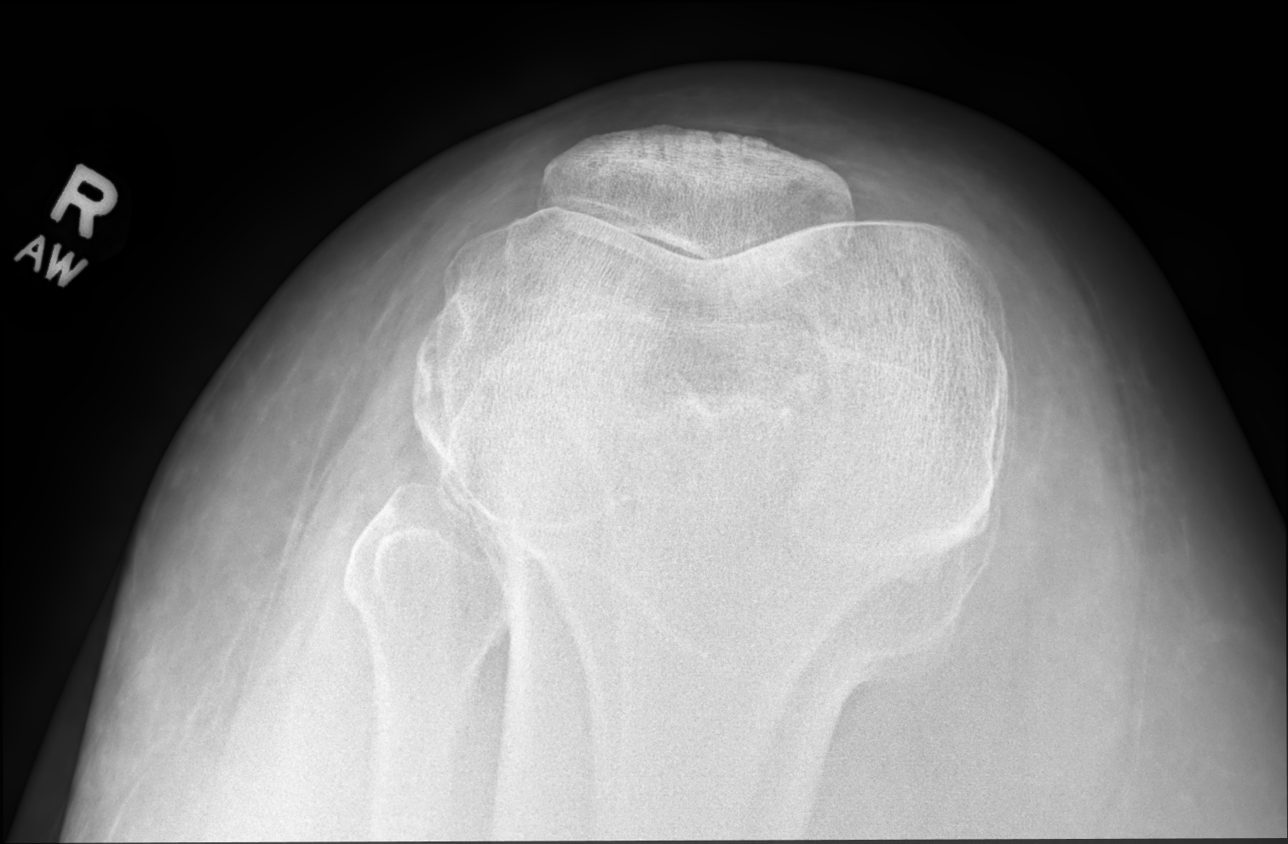

[4 of 4 positions shown; findings below may reference images not displayed]

FINDINGS: No acute fracture or dislocation. No joint effusion. No evidence for
remote trauma. Mild degenerative osteoarthritic changes about the
knee with degenerative joint space narrowing and juxta-articular
spurring, most notable at the medial femorotibial articulation. Mild
spurring at the intercondylar eminence. Osseous mineralization
normal. No soft tissue abnormality.
IMPRESSION: 1. Mild degenerative osteoarthritic changes about the knee.
2. No acute osseous abnormality.

## 2020-04-18 MED FILL — VIT D2 1.25 MG (50,000 UNIT: 1.25 MG | 56 days supply | Qty: 8 | Fill #3

## 2020-04-25 ENCOUNTER — Ambulatory Visit: Payer: No Typology Code available for payment source | Admitting: Family Medicine

## 2020-04-29 ENCOUNTER — Other Ambulatory Visit: Payer: Self-pay

## 2020-04-29 ENCOUNTER — Other Ambulatory Visit (HOSPITAL_COMMUNITY): Payer: Self-pay | Admitting: Family Medicine

## 2020-04-29 ENCOUNTER — Encounter: Payer: Self-pay | Admitting: Family Medicine

## 2020-04-29 DIAGNOSIS — E8881 Metabolic syndrome: Secondary | ICD-10-CM

## 2020-04-29 MED ORDER — METFORMIN HCL 500 MG PO TABS: ORAL_TABLET | ORAL | 0 refills | Status: DC

## 2020-04-29 MED ORDER — METFORMIN HCL 500 MG PO TABS: ORAL_TABLET | ORAL | 1 refills | Status: DC

## 2020-04-29 MED FILL — METFORMIN HCL 500 MG TABS: 500 | 30 days supply | Qty: 30 | Fill #0

## 2020-04-29 MED FILL — CHLORTHALIDONE 25 MG TABS: 25 | 90 days supply | Qty: 45 | Fill #2

## 2020-05-24 ENCOUNTER — Encounter: Payer: Self-pay | Admitting: Family Medicine

## 2020-05-24 ENCOUNTER — Other Ambulatory Visit: Payer: Self-pay

## 2020-05-24 ENCOUNTER — Ambulatory Visit (INDEPENDENT_AMBULATORY_CARE_PROVIDER_SITE_OTHER): Payer: No Typology Code available for payment source | Admitting: Family Medicine

## 2020-05-24 VITALS — BP 122/70 | HR 79 | Temp 97.9°F | Ht 73.0 in | Wt 354.2 lb

## 2020-05-24 DIAGNOSIS — R3912 Poor urinary stream: Secondary | ICD-10-CM

## 2020-05-24 DIAGNOSIS — Z0000 Encounter for general adult medical examination without abnormal findings: Secondary | ICD-10-CM

## 2020-05-24 DIAGNOSIS — N401 Enlarged prostate with lower urinary tract symptoms: Secondary | ICD-10-CM | POA: Diagnosis not present

## 2020-05-24 DIAGNOSIS — E559 Vitamin D deficiency, unspecified: Secondary | ICD-10-CM | POA: Diagnosis not present

## 2020-05-24 DIAGNOSIS — Z9189 Other specified personal risk factors, not elsewhere classified: Secondary | ICD-10-CM

## 2020-05-24 DIAGNOSIS — Z23 Encounter for immunization: Secondary | ICD-10-CM

## 2020-05-24 DIAGNOSIS — Z6841 Body Mass Index (BMI) 40.0 and over, adult: Secondary | ICD-10-CM | POA: Diagnosis not present

## 2020-05-24 DIAGNOSIS — R030 Elevated blood-pressure reading, without diagnosis of hypertension: Secondary | ICD-10-CM | POA: Diagnosis not present

## 2020-05-24 DIAGNOSIS — R609 Edema, unspecified: Secondary | ICD-10-CM

## 2020-05-24 DIAGNOSIS — E66813 Obesity, class 3: Secondary | ICD-10-CM

## 2020-05-24 NOTE — Progress Notes (Signed)
Established Patient Office Visit  Subjective:  Patient ID: Justin Bowen, male    DOB: October 19, 1960  Age: 59 y.o. MRN: [id number]  CC:  Chief Complaint  Patient presents with  . Follow-up    6 month follow up, no concerns.     HPI Justin Bowen presents for follow-up of lower extremity edema, vitamin D deficiency, BPH and at risk for diabetes.  Continues low-dose Metformin without issue.  Chlorthalidone continues to help control edema.  He is taking high-dose weekly vitamin D.  Continues to work remodeling their home.  Status post Covid vaccine.  He was not fasting for prior blood work.  Past Medical History:  Diagnosis Date  . BPH (benign prostatic hyperplasia)   . Chicken pox   . Fatigue   . Heartburn   . Knee pain   . Leg edema     Past Surgical History:  Procedure Laterality Date  . URETHRA SURGERY      Family History  Problem Relation Age of Onset  . Stroke Father 60  . Cancer Brother        brain cancer   . Cancer Paternal Grandmother 26       pancreatic cancer     Social History   Socioeconomic History  . Marital status: Married    Spouse name: Greydon Betke  . Number of children: Not on file  . Years of education: Not on file  . Highest education level: Not on file  Occupational History  . Not on file  Tobacco Use  . Smoking status: Never Smoker  . Smokeless tobacco: Never Used  Vaping Use  . Vaping Use: Never used  Substance and Sexual Activity  . Alcohol use: Never  . Drug use: Never  . Sexual activity: Not on file  Other Topics Concern  . 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 Difficulty of Paying Living Expenses: Not on file  Food Insecurity:   . Worried About Programme researcher, broadcasting/film/video in the Last Year: Not on file  . Ran Out of Food in the Last Year: Not on file  Transportation Needs:   . Lack of Transportation (Medical):  Not on file  . Lack of Transportation (Non-Medical): Not on file  Physical Activity:   . Days of Exercise per Week: Not on file  . Minutes of Exercise per Session: Not on file  Stress:   . Feeling of Stress : Not on file  Social Connections:   . Frequency of Communication with Friends and Family: Not on file  . Frequency of Social Gatherings with Friends and Family: Not on file  . Attends Religious Services: Not on file  . Active Member of Clubs or Organizations: Not on file  . Attends Banker Meetings: Not on file  . Marital Status: Not on file  Intimate Partner Violence:   . Fear of Current or Ex-Partner: Not on file  . Emotionally Abused: Not on file  . Physically Abused: Not on file  . Sexually Abused: Not on file    Outpatient Medications Prior to Visit  Medication Sig Dispense Refill  . chlorthalidone (HYGROTON) 25 MG tablet Take 0.5 tablets (12.5 mg total) by mouth daily. 90 tablet 1  . Cyanocobalamin (B-12 PO) Take by mouth.    . metFORMIN (GLUCOPHAGE) 500 MG tablet TAKE 1  TABLET BY MOUTH ONCE DAILY WITH BREAKFAST 30 tablet 0  . Multiple Vitamins-Minerals (MULTIVITAMIN WITH MINERALS) tablet Take 1 tablet by mouth daily.    . tamsulosin (FLOMAX) 0.4 MG CAPS capsule Take 1 capsule (0.4 mg total) by mouth daily. 90 capsule 1  . Vitamin D, Ergocalciferol, (DRISDOL) 1.25 MG (50000 UNIT) CAPS capsule TAKE 1 CAPSULE BY MOUTH EVERY 7 DAYS. 8 capsule 3  . CRANBERRY EXTRACT PO Take by mouth. (Patient not taking: Reported on 05/24/2020)    . phenazopyridine (PYRIDIUM) 97 MG tablet Take 2 tablets by mouth in the morning. (Patient not taking: Reported on 05/24/2020)    . Probiotic Product (PROBIOTIC-10 PO) Take by mouth. (Patient not taking: Reported on 05/24/2020)    . sildenafil (REVATIO) 20 MG tablet Take 3-5 tablets 45 minutes prior daily as needed. (Patient not taking: Reported on 05/24/2020) 50 tablet 1   No facility-administered medications prior to visit.     Allergies  Allergen Reactions  . Sulfa Antibiotics Rash    ROS Review of Systems  Constitutional: Negative.   HENT: Negative.   Eyes: Negative for photophobia and visual disturbance.  Respiratory: Negative.   Cardiovascular: Negative.   Gastrointestinal: Negative.   Endocrine: Negative for polyphagia and polyuria.  Genitourinary: Negative.   Musculoskeletal: Negative for gait problem and joint swelling.  Skin: Negative for pallor and rash.  Neurological: Negative for tremors and speech difficulty.  Hematological: Does not bruise/bleed easily.  Psychiatric/Behavioral: Negative.       Objective:    Physical Exam Vitals and nursing note reviewed.  Constitutional:      General: He is not in acute distress.    Appearance: Normal appearance. He is obese. He is not ill-appearing, toxic-appearing or diaphoretic.  HENT:     Head: Normocephalic and atraumatic.     Right Ear: Tympanic membrane, ear canal and external ear normal.     Left Ear: Tympanic membrane, ear canal and external ear normal.     Nose: Nose normal.  Eyes:     General: No scleral icterus.       Right eye: No discharge.        Left eye: No discharge.     Extraocular Movements: Extraocular movements intact.     Conjunctiva/sclera: Conjunctivae normal.     Pupils: Pupils are equal, round, and reactive to light.  Cardiovascular:     Rate and Rhythm: Normal rate and regular rhythm.  Pulmonary:     Effort: Pulmonary effort is normal.     Breath sounds: Normal breath sounds.  Musculoskeletal:        General: Swelling present.     Cervical back: No rigidity or tenderness.     Right lower leg: Edema present.     Left lower leg: Edema present.  Lymphadenopathy:     Cervical: No cervical adenopathy.  Skin:    General: Skin is warm and dry.  Neurological:     Mental Status: He is alert and oriented to person, place, and time.  Psychiatric:        Mood and Affect: Mood normal.        Behavior: Behavior  normal.     BP 122/70   Pulse 79   Temp 97.9 F (36.6 C) (Tympanic)   Ht 6\' 1"  (1.854 m)   Wt (!) 354 lb 3.2 oz (160.7 kg)   SpO2 95%   BMI 46.73 kg/m  Wt Readings from Last 3 Encounters:  05/24/20 (!) 354 lb 3.2 oz (160.7 kg)  02/27/20 (!) 340 lb (154.2 kg)  10/23/19 (!) 354 lb 9.6 oz (160.8 kg)     Health Maintenance Due  Topic Date Due  . COVID-19 Vaccine (1) Never done  . COLONOSCOPY  Never done  . INFLUENZA VACCINE  03/03/2020    There are no preventive care reminders to display for this patient.  Lab Results  Component Value Date   TSH 2.180 04/21/2018   Lab Results  Component Value Date   WBC 7.7 10/23/2019   HGB 14.7 10/23/2019   HCT 43.0 10/23/2019   MCV 85.4 10/23/2019   PLT 259.0 10/23/2019   Lab Results  Component Value Date   NA 139 10/23/2019   K 4.0 10/23/2019   CO2 30 10/23/2019   GLUCOSE 102 (H) 10/23/2019   BUN 23 10/23/2019   CREATININE 1.11 10/23/2019   BILITOT 0.5 10/23/2019   ALKPHOS 102 10/23/2019   AST 12 10/23/2019   ALT 16 10/23/2019   PROT 7.1 10/23/2019   ALBUMIN 4.0 10/23/2019   CALCIUM 9.1 10/23/2019   GFR 67.69 10/23/2019   Lab Results  Component Value Date   CHOL 162 10/23/2019   Lab Results  Component Value Date   HDL 35.50 (L) 10/23/2019   Lab Results  Component Value Date   LDLCALC 105 (H) 10/05/2018   Lab Results  Component Value Date   TRIG 232.0 (H) 10/23/2019   Lab Results  Component Value Date   CHOLHDL 5 10/23/2019   Lab Results  Component Value Date   HGBA1C 5.1 10/23/2019      Assessment & Plan:   Problem List Items Addressed This Visit      Other   Benign prostatic hyperplasia with weak urinary stream   Class 3 severe obesity with serious comorbidity and body mass index (BMI) of 40.0 to 44.9 in adult [facility]) - Primary   Healthcare maintenance   Edema   Elevated BP without diagnosis of hypertension   Relevant Orders   Basic metabolic panel   Vitamin D deficiency   Relevant Orders    VITAMIN D 25 Hydroxy (Vit-D Deficiency, Fractures)   At risk for diabetes mellitus   Relevant Orders   Basic metabolic panel   Hemoglobin A1c      No orders of the defined types were placed in this encounter.   Follow-up: Return in about 6 months (around 11/22/2020).  Continue current medications.  We discussed a referral for bariatric surgery and he declines for now.  Suggested weight watchers.  Mliss Sax, MD

## 2020-05-25 LAB — BASIC METABOLIC PANEL
BUN: 17 mg/dL (ref 7–25)
CO2: 24 mmol/L (ref 20–32)
Calcium: 9.4 mg/dL (ref 8.6–10.3)
Chloride: 107 mmol/L (ref 98–110)
Creat: 1.21 mg/dL (ref 0.70–1.25)
Glucose, Bld: 92 mg/dL (ref 65–99)
Potassium: 4 mmol/L (ref 3.5–5.3)
Sodium: 140 mmol/L (ref 135–146)

## 2020-05-25 LAB — HEMOGLOBIN A1C
Hgb A1c MFr Bld: 5.2 % of total Hgb (ref <5.7)
Mean Plasma Glucose: 103 (calc)
eAG (mmol/L): 5.7 (calc)

## 2020-05-25 LAB — VITAMIN D 25 HYDROXY (VIT D DEFICIENCY, FRACTURES): Vit D, 25-Hydroxy: 57 ng/mL (ref 30–100)

## 2020-06-02 ENCOUNTER — Encounter: Payer: Self-pay | Admitting: Family Medicine

## 2020-06-03 ENCOUNTER — Other Ambulatory Visit: Payer: Self-pay

## 2020-06-03 ENCOUNTER — Other Ambulatory Visit (HOSPITAL_COMMUNITY): Payer: Self-pay | Admitting: Family Medicine

## 2020-06-03 DIAGNOSIS — R3912 Poor urinary stream: Secondary | ICD-10-CM

## 2020-06-03 DIAGNOSIS — N401 Enlarged prostate with lower urinary tract symptoms: Secondary | ICD-10-CM

## 2020-06-03 DIAGNOSIS — E8881 Metabolic syndrome: Secondary | ICD-10-CM

## 2020-06-03 MED ORDER — METFORMIN HCL 500 MG PO TABS: ORAL_TABLET | ORAL | 0 refills | Status: DC

## 2020-06-03 MED ORDER — TAMSULOSIN HCL 0.4 MG PO CAPS: 0.4000 mg | ORAL_CAPSULE | Freq: Every day | ORAL | 0 refills | Status: DC

## 2020-06-03 MED FILL — METFORMIN HCL 500 MG TABS: 500 | 90 days supply | Qty: 90 | Fill #0

## 2020-06-03 MED FILL — TAMSULOSIN HCL 0.4 MG CAP: 0.4 | 90 days supply | Qty: 90 | Fill #0

## 2020-08-02 MED FILL — CHLORTHALIDONE 25 MG TABS: 25 | 90 days supply | Qty: 45 | Fill #3

## 2020-09-05 ENCOUNTER — Other Ambulatory Visit (HOSPITAL_COMMUNITY): Payer: Self-pay | Admitting: Family Medicine

## 2020-09-05 ENCOUNTER — Encounter: Payer: Self-pay | Admitting: Family Medicine

## 2020-09-05 ENCOUNTER — Other Ambulatory Visit: Payer: Self-pay | Admitting: Family Medicine

## 2020-09-05 DIAGNOSIS — N401 Enlarged prostate with lower urinary tract symptoms: Secondary | ICD-10-CM

## 2020-09-05 DIAGNOSIS — E8881 Metabolic syndrome: Secondary | ICD-10-CM

## 2020-09-05 MED ORDER — METFORMIN HCL 500 MG PO TABS: ORAL_TABLET | ORAL | 0 refills | Status: DC

## 2020-09-05 MED FILL — METFORMIN HCL 500 MG TABS: 500 | 90 days supply | Qty: 90 | Fill #0

## 2020-09-05 MED FILL — TAMSULOSIN HCL 0.4 MG CAP: 0.4 | 90 days supply | Qty: 90 | Fill #0

## 2020-09-23 ENCOUNTER — Encounter: Payer: Self-pay | Admitting: Family Medicine

## 2020-10-20 ENCOUNTER — Other Ambulatory Visit: Payer: Self-pay | Admitting: Family Medicine

## 2020-10-20 DIAGNOSIS — E559 Vitamin D deficiency, unspecified: Secondary | ICD-10-CM

## 2020-10-20 DIAGNOSIS — N401 Enlarged prostate with lower urinary tract symptoms: Secondary | ICD-10-CM

## 2020-10-20 DIAGNOSIS — R3912 Poor urinary stream: Secondary | ICD-10-CM

## 2020-10-20 DIAGNOSIS — Z0000 Encounter for general adult medical examination without abnormal findings: Secondary | ICD-10-CM

## 2020-10-20 DIAGNOSIS — R609 Edema, unspecified: Secondary | ICD-10-CM

## 2020-10-20 DIAGNOSIS — R030 Elevated blood-pressure reading, without diagnosis of hypertension: Secondary | ICD-10-CM

## 2020-10-25 ENCOUNTER — Other Ambulatory Visit (HOSPITAL_BASED_OUTPATIENT_CLINIC_OR_DEPARTMENT_OTHER): Payer: Self-pay

## 2020-11-27 ENCOUNTER — Encounter: Payer: Self-pay | Admitting: Family Medicine

## 2020-11-27 ENCOUNTER — Ambulatory Visit (INDEPENDENT_AMBULATORY_CARE_PROVIDER_SITE_OTHER): Payer: No Typology Code available for payment source | Admitting: Family Medicine

## 2020-11-27 ENCOUNTER — Other Ambulatory Visit: Payer: Self-pay

## 2020-11-27 VITALS — BP 130/76 | HR 64 | Temp 97.3°F | Ht 73.0 in | Wt 348.2 lb

## 2020-11-27 DIAGNOSIS — E8881 Metabolic syndrome: Secondary | ICD-10-CM | POA: Diagnosis not present

## 2020-11-27 DIAGNOSIS — Z6841 Body Mass Index (BMI) 40.0 and over, adult: Secondary | ICD-10-CM

## 2020-11-27 DIAGNOSIS — N401 Enlarged prostate with lower urinary tract symptoms: Secondary | ICD-10-CM | POA: Diagnosis not present

## 2020-11-27 DIAGNOSIS — Z0000 Encounter for general adult medical examination without abnormal findings: Secondary | ICD-10-CM

## 2020-11-27 DIAGNOSIS — E559 Vitamin D deficiency, unspecified: Secondary | ICD-10-CM | POA: Diagnosis not present

## 2020-11-27 DIAGNOSIS — Z9189 Other specified personal risk factors, not elsewhere classified: Secondary | ICD-10-CM | POA: Diagnosis not present

## 2020-11-27 DIAGNOSIS — R3912 Poor urinary stream: Secondary | ICD-10-CM

## 2020-11-27 LAB — LIPID PANEL
Cholesterol: 163 mg/dL (ref 0–200)
HDL: 38 mg/dL — ABNORMAL LOW (ref >39.00)
LDL Cholesterol: 89 mg/dL (ref 0–99)
NonHDL: 124.7
Total CHOL/HDL Ratio: 4
Triglycerides: 181 mg/dL — ABNORMAL HIGH (ref 0.0–149.0)
VLDL: 36.2 mg/dL (ref 0.0–40.0)

## 2020-11-27 LAB — COMPREHENSIVE METABOLIC PANEL
ALT: 16 U/L (ref 0–53)
AST: 12 U/L (ref 0–37)
Albumin: 4.2 g/dL (ref 3.5–5.2)
Alkaline Phosphatase: 116 U/L (ref 39–117)
BUN: 17 mg/dL (ref 6–23)
CO2: 24 mEq/L (ref 19–32)
Calcium: 9.5 mg/dL (ref 8.4–10.5)
Chloride: 105 mEq/L (ref 96–112)
Creatinine, Ser: 1.28 mg/dL (ref 0.40–1.50)
GFR: 60.8 mL/min (ref >60.00)
Glucose, Bld: 95 mg/dL (ref 70–99)
Potassium: 4.1 mEq/L (ref 3.5–5.1)
Sodium: 141 mEq/L (ref 135–145)
Total Bilirubin: 0.6 mg/dL (ref 0.2–1.2)
Total Protein: 7.4 g/dL (ref 6.0–8.3)

## 2020-11-27 LAB — CBC
HCT: 45.6 % (ref 39.0–52.0)
Hemoglobin: 15.6 g/dL (ref 13.0–17.0)
MCHC: 34.2 g/dL (ref 30.0–36.0)
MCV: 85.7 fl (ref 78.0–100.0)
Platelets: 285 10*3/uL (ref 150.0–400.0)
RBC: 5.32 Mil/uL (ref 4.22–5.81)
RDW: 14.8 % (ref 11.5–15.5)
WBC: 8 10*3/uL (ref 4.0–10.5)

## 2020-11-27 LAB — PSA: PSA: 1.07 ng/mL (ref 0.10–4.00)

## 2020-11-27 LAB — HEMOGLOBIN A1C: Hgb A1c MFr Bld: 5.1 % (ref 4.6–6.5)

## 2020-11-27 LAB — VITAMIN D 25 HYDROXY (VIT D DEFICIENCY, FRACTURES): VITD: 26.51 ng/mL — ABNORMAL LOW (ref 30.00–100.00)

## 2020-11-27 NOTE — Patient Instructions (Signed)
BMI for Adults What is BMI? Body mass index (BMI) is a number that is calculated from a person's weight and height. BMI can help estimate how much of a person's weight is composed of fat. BMI does not measure body fat directly. Rather, it is an alternative to procedures that directly measure body fat, which can be difficult and expensive. BMI can help identify people who may be at higher risk for certain medical problems. What are BMI measurements used for? BMI is used as a screening tool to identify possible weight problems. It helps determine whether a person is obese, overweight, a healthy weight, or underweight. BMI is useful for:  Identifying a weight problem that may be related to a medical condition or may increase the risk for medical problems.  Promoting changes, such as changes in diet and exercise, to help reach a healthy weight. BMI screening can be repeated to see if these changes are working. How is BMI calculated? BMI involves measuring your weight in relation to your height. Both height and weight are measured, and the BMI is calculated from those numbers. This can be done either in [place] ([place] or metric measurements. Note that charts and online BMI calculators are available to help you find your BMI quickly and easily without having to do these calculations yourself. To calculate your BMI in [place] ([place] measurements: 1. Measure your weight in pounds (lb). 2. Multiply the number of pounds by 703. ? For example, for a person who weighs 180 lb, multiply that number by 703, which equals 126,540. 3. Measure your height in inches. Then multiply that number by itself to get a measurement called "inches squared." ? For example, for a person who is 70 inches tall, the "inches squared" measurement is 70 inches x 70 inches, which equals 4,900 inches squared. 4. Divide the total from step 2 (number of lb x 703) by the total from step 3 (inches squared): 126,540  4,900 = 25.8. This is  your BMI.   To calculate your BMI in metric measurements: 1. Measure your weight in kilograms (kg). 2. Measure your height in meters (m). Then multiply that number by itself to get a measurement called "meters squared." ? For example, for a person who is 1.75 m tall, the "meters squared" measurement is 1.75 m x 1.75 m, which is equal to 3.1 meters squared. 3. Divide the number of kilograms (your weight) by the meters squared number. In this example: 70  3.1 = 22.6. This is your BMI. What do the results mean? BMI charts are used to identify whether you are underweight, normal weight, overweight, or obese. The following guidelines will be used:  Underweight: BMI less than 18.5.  Normal weight: BMI between 18.5 and 24.9.  Overweight: BMI between 25 and 29.9.  Obese: BMI of 30 or above. Keep these notes in mind:  Weight includes both fat and muscle, so someone with a muscular build, such as an athlete, may have a BMI that is higher than 24.9. In cases like these, BMI is not an accurate measure of body fat.  To determine if excess body fat is the cause of a BMI of 25 or higher, further assessments may need to be done by a health care provider.  BMI is usually interpreted in the same way for men and women. Where to find more information For more information about BMI, including tools to quickly calculate your BMI, go to these websites:  Centers for Disease Control and Prevention: http://www.wolf.info/  American Heart Association: www.heart.org  National Heart, Lung, and Blood Institute: https://wilson-eaton.com/ Summary  Body mass index (BMI) is a number that is calculated from a person's weight and height.  BMI may help estimate how much of a person's weight is composed of fat. BMI can help identify those who may be at higher risk for certain medical problems.  BMI can be measured using English measurements or metric measurements.  BMI charts are used to identify whether you are underweight, normal  weight, overweight, or obese. This information is not intended to replace advice given to you by your health care provider. Make sure you discuss any questions you have with your health care provider. Document Revised: 04/12/2019 Document Reviewed: 02/17/2019 Elsevier Patient Education  2021 [place] [employer] Calories are units of energy. Your body needs a certain number of calories from food to keep going throughout the day. When you eat or drink more calories than your body needs, your body stores the extra calories mostly as fat. When you eat or drink fewer calories than your body needs, your body burns fat to get the energy it needs. Calorie counting means keeping track of how many calories you eat and drink each day. Calorie counting can be helpful if you need to lose weight. If you eat fewer calories than your body needs, you should lose weight. Ask your health care provider what a healthy weight is for you. For calorie counting to work, you will need to eat the right number of calories each day to lose a healthy amount of weight per week. A dietitian can help you figure out how many calories you need in a day and will suggest ways to reach your calorie goal.  A healthy amount of weight to lose each week is usually 1-2 lb (0.5-0.9 kg). This usually means that your daily calorie intake should be reduced by 500-750 calories.  Eating 1,200-1,500 calories a day can help most women lose weight.  Eating 1,500-1,800 calories a day can help most men lose weight. What do I need to know about calorie counting? Work with your health care provider or dietitian to determine how many calories you should get each day. To meet your daily calorie goal, you will need to:  Find out how many calories are in each food that you would like to eat. Try to do this before you eat.  Decide how much of the food you plan to eat.  Keep a food log. Do this by writing down what you ate and  how many calories it had. To successfully lose weight, it is important to balance calorie counting with a healthy lifestyle that includes regular activity. Where do I find calorie information? The number of calories in a food can be found on a Nutrition Facts label. If a food does not have a Nutrition Facts label, try to look up the calories online or ask your dietitian for help. Remember that calories are listed per serving. If you choose to have more than one serving of a food, you will have to multiply the calories per serving by the number of servings you plan to eat. For example, the label on a package of bread might say that a serving size is 1 slice and that there are 90 calories in a serving. If you eat 1 slice, you will have eaten 90 calories. If you eat 2 slices, you will have eaten 180 calories.   How do I keep a  food log? After each time that you eat, record the following in your food log as soon as possible:  What you ate. Be sure to include toppings, sauces, and other extras on the food.  How much you ate. This can be measured in cups, ounces, or number of items.  How many calories were in each food and drink.  The total number of calories in the food you ate. Keep your food log near you, such as in a pocket-sized notebook or on an app or website on your mobile phone. Some programs will calculate calories for you and show you how many calories you have left to meet your daily goal. What are some portion-control tips?  Know how many calories are in a serving. This will help you know how many servings you can have of a certain food.  Use a measuring cup to measure serving sizes. You could also try weighing out portions on a kitchen scale. With time, you will be able to estimate serving sizes for some foods.  Take time to put servings of different foods on your favorite plates or in your favorite bowls and cups so you know what a serving looks like.  Try not to eat straight from a  food's packaging, such as from a bag or box. Eating straight from the package makes it hard to see how much you are eating and can lead to overeating. Put the amount you would like to eat in a cup or on a plate to make sure you are eating the right portion.  Use smaller plates, glasses, and bowls for smaller portions and to prevent overeating.  Try not to multitask. For example, avoid watching TV or using your computer while eating. If it is time to eat, sit down at a table and enjoy your food. This will help you recognize when you are full. It will also help you be more mindful of what and how much you are eating. What are tips for following this plan? Reading food labels  Check the calorie count compared with the serving size. The serving size may be smaller than what you are used to eating.  Check the source of the calories. Try to choose foods that are high in protein, fiber, and vitamins, and low in saturated fat, trans fat, and sodium. Shopping  Read nutrition labels while you shop. This will help you make healthy decisions about which foods to buy.  Pay attention to nutrition labels for low-fat or fat-free foods. These foods sometimes have the same number of calories or more calories than the full-fat versions. They also often have added sugar, starch, or salt to make up for flavor that was removed with the fat.  Make a grocery list of lower-calorie foods and stick to it. Cooking  Try to cook your favorite foods in a healthier way. For example, try baking instead of frying.  Use low-fat dairy products. Meal planning  Use more fruits and vegetables. One-half of your plate should be fruits and vegetables.  Include lean proteins, such as chicken, [place], and fish. Lifestyle Each week, aim to do one of the following:  150 minutes of moderate exercise, such as walking.  75 minutes of vigorous exercise, such as running. General information  Know how many calories are in the foods  you eat most often. This will help you calculate calorie counts faster.  Find a way of tracking calories that works for you. Get creative. Try different apps or programs if writing  down calories does not work for you. What foods should I eat?  Eat nutritious foods. It is better to have a nutritious, high-calorie food, such as an avocado, than a food with few nutrients, such as a bag of potato chips.  Use your calories on foods and drinks that will fill you up and will not leave you hungry soon after eating. ? Examples of foods that fill you up are nuts and nut butters, vegetables, lean proteins, and high-fiber foods such as whole grains. High-fiber foods are foods with more than 5 g of fiber per serving.  Pay attention to calories in drinks. Low-calorie drinks include water and unsweetened drinks. The items listed above may not be a complete list of foods and beverages you can eat. Contact a dietitian for more information.   What foods should I limit? Limit foods or drinks that are not good sources of vitamins, minerals, or protein or that are high in unhealthy fats. These include:  Candy.  Other sweets.  Sodas, specialty coffee drinks, alcohol, and juice. The items listed above may not be a complete list of foods and beverages you should avoid. Contact a dietitian for more information. How do I count calories when eating out?  Pay attention to portions. Often, portions are much larger when eating out. Try these tips to keep portions smaller: ? Consider sharing a meal instead of getting your own. ? If you get your own meal, eat only half of it. Before you start eating, ask for a container and put half of your meal into it. ? When available, consider ordering smaller portions from the menu instead of full portions.  Pay attention to your food and drink choices. Knowing the way food is cooked and what is included with the meal can help you eat fewer calories. ? If calories are listed on  the menu, choose the lower-calorie options. ? Choose dishes that include vegetables, fruits, whole grains, low-fat dairy products, and lean proteins. ? Choose items that are boiled, broiled, grilled, or steamed. Avoid items that are buttered, battered, fried, or served with cream sauce. Items labeled as crispy are usually fried, unless stated otherwise. ? Choose water, low-fat milk, unsweetened iced tea, or other drinks without added sugar. If you want an alcoholic beverage, choose a lower-calorie option, such as a glass of wine or light beer. ? Ask for dressings, sauces, and syrups on the side. These are usually high in calories, so you should limit the amount you eat. ? If you want a salad, choose a garden salad and ask for grilled meats. Avoid extra toppings such as bacon, cheese, or fried items. Ask for the dressing on the side, or ask for olive oil and vinegar or lemon to use as dressing.  Estimate how many servings of a food you are given. Knowing serving sizes will help you be aware of how much food you are eating at restaurants. Where to find more information  Centers for Disease Control and Prevention: http://www.wolf.info/  U.S. Department of Agriculture: http://www.wilson-mendoza.org/ Summary  Calorie counting means keeping track of how many calories you eat and drink each day. If you eat fewer calories than your body needs, you should lose weight.  A healthy amount of weight to lose per week is usually 1-2 lb (0.5-0.9 kg). This usually means reducing your daily calorie intake by 500-750 calories.  The number of calories in a food can be found on a Nutrition Facts label. If a food does not have a  Nutrition Facts label, try to look up the calories online or ask your dietitian for help.  Use smaller plates, glasses, and bowls for smaller portions and to prevent overeating.  Use your calories on foods and drinks that will fill you up and not leave you hungry shortly after a meal. This information is not intended  to replace advice given to you by your health care provider. Make sure you discuss any questions you have with your health care provider. Document Revised: 08/31/2019 Document Reviewed: 08/31/2019 Elsevier Patient Education  2021 Elsevier Inc.  Health Maintenance, Male Adopting a healthy lifestyle and getting preventive care are important in promoting health and wellness. Ask your health care provider about:  The right schedule for you to have regular tests and exams.  Things you can do on your own to prevent diseases and keep yourself healthy. What should I know about diet, weight, and exercise? Eat a healthy diet  Eat a diet that includes plenty of vegetables, fruits, low-fat dairy products, and lean protein.  Do not eat a lot of foods that are high in solid fats, added sugars, or sodium.   Maintain a healthy weight Body mass index (BMI) is a measurement that can be used to identify possible weight problems. It estimates body fat based on height and weight. Your health care provider can help determine your BMI and help you achieve or maintain a healthy weight. Get regular exercise Get regular exercise. This is one of the most important things you can do for your health. Most adults should:  Exercise for at least 150 minutes each week. The exercise should increase your heart rate and make you sweat (moderate-intensity exercise).  Do strengthening exercises at least twice a week. This is in addition to the moderate-intensity exercise.  Spend less time sitting. Even light physical activity can be beneficial. Watch cholesterol and blood lipids Have your blood tested for lipids and cholesterol at 61 years of age, then have this test every 5 years. You may need to have your cholesterol levels checked more often if:  Your lipid or cholesterol levels are high.  You are older than 61 years of age.  You are at high risk for heart disease. What should I know about cancer screening? Many  types of cancers can be detected early and may often be prevented. Depending on your health history and family history, you may need to have cancer screening at various ages. This may include screening for:  Colorectal cancer.  Prostate cancer.  Skin cancer.  Lung cancer. What should I know about heart disease, diabetes, and high blood pressure? Blood pressure and heart disease  High blood pressure causes heart disease and increases the risk of stroke. This is more likely to develop in people who have high blood pressure readings, are of African descent, or are overweight.  Talk with your health care provider about your target blood pressure readings.  Have your blood pressure checked: ? Every 3-5 years if you are 6-83 years of age. ? Every year if you are 78 years old or older.  If you are between the ages of 95 and 47 and are a current or former smoker, ask your health care provider if you should have a one-time screening for abdominal aortic aneurysm (AAA). Diabetes Have regular diabetes screenings. This checks your fasting blood sugar level. Have the screening done:  Once every three years after age 61 if you are at a normal weight and have a low risk for  diabetes.  More often and at a younger age if you are overweight or have a high risk for diabetes. What should I know about preventing infection? Hepatitis B If you have a higher risk for hepatitis B, you should be screened for this virus. Talk with your health care provider to find out if you are at risk for hepatitis B infection. Hepatitis C Blood testing is recommended for:  Everyone born from [birth year] through 1966.  Anyone with known risk factors for hepatitis C. Sexually transmitted infections (STIs)  You should be screened each year for STIs, including gonorrhea and chlamydia, if: ? You are sexually active and are younger than 61 years of age. ? You are older than 61 years of age and your health care provider tells you  that you are at risk for this type of infection. ? Your sexual activity has changed since you were last screened, and you are at increased risk for chlamydia or gonorrhea. Ask your health care provider if you are at risk.  Ask your health care provider about whether you are at high risk for HIV. Your health care provider may recommend a prescription medicine to help prevent HIV infection. If you choose to take medicine to prevent HIV, you should first get tested for HIV. You should then be tested every 3 months for as long as you are taking the medicine. Follow these instructions at home: Lifestyle  Do not use any products that contain nicotine or tobacco, such as cigarettes, e-cigarettes, and chewing tobacco. If you need help quitting, ask your health care provider.  Do not use street drugs.  Do not share needles.  Ask your health care provider for help if you need support or information about quitting drugs. Alcohol use  Do not drink alcohol if your health care provider tells you not to drink.  If you drink alcohol: ? Limit how much you have to 0-2 drinks a day. ? Be aware of how much alcohol is in your drink. In the [place] one drink equals one 12 oz bottle of beer (355 mL), one 5 oz glass of wine (148 mL), or one 1 oz glass of hard liquor (44 mL). General instructions  Schedule regular health, dental, and eye exams.  Stay current with your vaccines.  Tell your health care provider if: ? You often feel depressed. ? You have ever been abused or do not feel safe at home. Summary  Adopting a healthy lifestyle and getting preventive care are important in promoting health and wellness.  Follow your health care provider's instructions about healthy diet, exercising, and getting tested or screened for diseases.  Follow your health care provider's instructions on monitoring your cholesterol and blood pressure. This information is not intended to replace advice given to you by your  health care provider. Make sure you discuss any questions you have with your health care provider. Document Revised: 07/13/2018 Document Reviewed: 07/13/2018 Elsevier Patient Education  2021 Elsevier Inc.  Diabetes Care, 44(Suppl 1), [phone]. https://doi.org/https://doi.org/10.2337/dc21-S003">  Prediabetes Prediabetes is when your blood sugar (blood glucose) level is higher than normal but not high enough for you to be diagnosed with type 2 diabetes. Having prediabetes puts you at risk for developing type 2 diabetes (type 2 diabetes mellitus). With certain lifestyle changes, you may be able to prevent or delay the onset of type 2 diabetes. This is important because type 2 diabetes can lead to serious complications, such as:  Heart disease.  Stroke.  Blindness.  Kidney disease.  Depression.  Poor circulation in the feet and legs. In severe cases, this could lead to surgical removal of a leg (amputation). What are the causes? The exact cause of prediabetes is not known. It may result from insulin resistance. Insulin resistance develops when cells in the body do not respond properly to insulin that the body makes. This can cause excess glucose to build up in the blood. High blood glucose (hyperglycemia) can develop. What increases the risk? The following factors may make you more likely to develop this condition:  You have a family member with type 2 diabetes.  You are older than 45 years.  You had a temporary form of diabetes during a pregnancy (gestational diabetes).  You had polycystic ovary syndrome (PCOS).  You are overweight or obese.  You are inactive (sedentary).  You have a history of heart disease, including problems with cholesterol levels, high levels of blood fats, or high blood pressure. What are the signs or symptoms? You may have no symptoms. If you do have symptoms, they may include:  Increased hunger.  Increased thirst.  Increased urination.  Vision  changes, such as blurry vision.  Tiredness (fatigue). How is this diagnosed? This condition can be diagnosed with blood tests. Your blood glucose may be checked with one or more of the following tests:  A fasting blood glucose (FBG) test. You will not be allowed to eat (you will fast) for at least 8 hours before a blood sample is taken.  An A1C blood test (hemoglobin A1C). This test provides information about blood glucose levels over the previous 2?3 months.  An oral glucose tolerance test (OGTT). This test measures your blood glucose at two points in time: ? After fasting. This is your baseline level. ? Two hours after you drink a beverage that contains glucose. You may be diagnosed with prediabetes if:  Your FBG is 100?125 mg/dL (0.0-9.3 mmol/L).  Your A1C level is 5.7?6.4% (39-46 mmol/mol).  Your OGTT result is 140?199 mg/dL (8.1-82 mmol/L). These blood tests may be repeated to confirm your diagnosis.   How is this treated? Treatment may include dietary and lifestyle changes to help lower your blood glucose and prevent type 2 diabetes from developing. In some cases, medicine may be prescribed to help lower the risk of type 2 diabetes. Follow these instructions at home: Nutrition  Follow a healthy meal plan. This includes eating lean proteins, whole grains, legumes, fresh fruits and vegetables, low-fat dairy products, and healthy fats.  Follow instructions from your health care provider about eating or drinking restrictions.  Meet with a dietitian to create a healthy eating plan that is right for you.   Lifestyle  Do moderate-intensity exercise for at least 30 minutes a day on 5 or more days each week, or as told by your health care provider. A mix of activities may be best, such as: ? Brisk walking, swimming, biking, and weight lifting.  Lose weight as told by your health care provider. Losing 5-7% of your body weight can reverse insulin resistance.  Do not drink alcohol  if: ? Your health care provider tells you not to drink. ? You are pregnant, may be pregnant, or are planning to become pregnant.  If you drink alcohol: ? Limit how much you use to:  0-1 drink a day for women.  0-2 drinks a day for men. ? Be aware of how much alcohol is in your drink. In the [place] one drink equals one 12 oz bottle of beer (355  mL), one 5 oz glass of wine (148 mL), or one 1 oz glass of hard liquor (44 mL). General instructions  Take over-the-counter and prescription medicines only as told by your health care provider. You may be prescribed medicines that help lower the risk of type 2 diabetes.  Do not use any products that contain nicotine or tobacco, such as cigarettes, e-cigarettes, and chewing tobacco. If you need help quitting, ask your health care provider.  Keep all follow-up visits. This is important. Where to find more information  American Diabetes Association: www.diabetes.org  Academy of Nutrition and Dietetics: www.eatright.org  American Heart Association: www.heart.org Contact a health care provider if:  You have any of these symptoms: ? Increased hunger. ? Increased urination. ? Increased thirst. ? Fatigue. ? Vision changes, such as blurry vision. Get help right away if you:  Have shortness of breath.  Feel confused.  Vomit or feel like you may vomit. Summary  Prediabetes is when your blood sugar (blood glucose)level is higher than normal but not high enough for you to be diagnosed with type 2 diabetes.  Having prediabetes puts you at risk for developing type 2 diabetes (type 2 diabetes mellitus).  Make lifestyle changes such as eating a healthy diet and exercising regularly to help prevent diabetes. Lose weight as told by your health care provider. This information is not intended to replace advice given to you by your health care provider. Make sure you discuss any questions you have with your health care provider. Document Revised:  10/19/2019 Document Reviewed: 10/19/2019 Elsevier Patient Education  2021 [employer]  Preventive Care 7640-61 Years Old, Male Preventive care refers to lifestyle choices and visits with your health care provider that can promote health and wellness. This includes:  A yearly physical exam. This is also called an annual wellness visit.  Regular dental and eye exams.  Immunizations.  Screening for certain conditions.  Healthy lifestyle choices, such as: ? Eating a healthy diet. ? Getting regular exercise. ? Not using drugs or products that contain nicotine and tobacco. ? Limiting alcohol use. What can I expect for my preventive care visit? Physical exam Your health care provider will check your:  Height and weight. These may be used to calculate your BMI (body mass index). BMI is a measurement that tells if you are at a healthy weight.  Heart rate and blood pressure.  Body temperature.  Skin for abnormal spots. Counseling Your health care provider may ask you questions about your:  Past medical problems.  Family's medical history.  Alcohol, tobacco, and drug use.  Emotional well-being.  Home life and relationship well-being.  Sexual activity.  Diet, exercise, and sleep habits.  Work and work Astronomerenvironment.  Access to firearms. What immunizations do I need? Vaccines are usually given at various ages, according to a schedule. Your health care provider will recommend vaccines for you based on your age, medical history, and lifestyle or other factors, such as travel or where you work.   What tests do I need? Blood tests  Lipid and cholesterol levels. These may be checked every 5 years, or more often if you are over 61 years old.  Hepatitis C test.  Hepatitis B test. Screening  Lung cancer screening. You may have this screening every year starting at age 61 if you have a 30-pack-year history of smoking and currently smoke or have quit within the past 15  years.  Prostate cancer screening. Recommendations will vary depending on your family history and other  risks.  Genital exam to check for testicular cancer or hernias.  Colorectal cancer screening. ? All adults should have this screening starting at age 36 and continuing until age 56. ? Your health care provider may recommend screening at age 62 if you are at increased risk. ? You will have tests every 1-10 years, depending on your results and the type of screening test.  Diabetes screening. ? This is done by checking your blood sugar (glucose) after you have not eaten for a while (fasting). ? You may have this done every 1-3 years.  STD (sexually transmitted disease) testing, if you are at risk. Follow these instructions at home: Eating and drinking  Eat a diet that includes fresh fruits and vegetables, whole grains, lean protein, and low-fat dairy products.  Take vitamin and mineral supplements as recommended by your health care provider.  Do not drink alcohol if your health care provider tells you not to drink.  If you drink alcohol: ? Limit how much you have to 0-2 drinks a day. ? Be aware of how much alcohol is in your drink. In the [place] one drink equals one 12 oz bottle of beer (355 mL), one 5 oz glass of wine (148 mL), or one 1 oz glass of hard liquor (44 mL).   Lifestyle  Take daily care of your teeth and gums. Brush your teeth every morning and night with fluoride toothpaste. Floss one time each day.  Stay active. Exercise for at least 30 minutes 5 or more days each week.  Do not use any products that contain nicotine or tobacco, such as cigarettes, e-cigarettes, and chewing tobacco. If you need help quitting, ask your health care provider.  Do not use drugs.  If you are sexually active, practice safe sex. Use a condom or other form of protection to prevent STIs (sexually transmitted infections).  If told by your health care provider, take low-dose aspirin daily  starting at age 37.  Find healthy ways to cope with stress, such as: ? Meditation, yoga, or listening to music. ? Journaling. ? Talking to a trusted person. ? Spending time with friends and family. Safety  Always wear your seat belt while driving or riding in a vehicle.  Do not drive: ? If you have been drinking alcohol. Do not ride with someone who has been drinking. ? When you are tired or distracted. ? While texting.  Wear a helmet and other protective equipment during sports activities.  If you have firearms in your house, make sure you follow all gun safety procedures. What's next?  Go to your health care provider once a year for an annual wellness visit.  Ask your health care provider how often you should have your eyes and teeth checked.  Stay up to date on all vaccines. This information is not intended to replace advice given to you by your health care provider. Make sure you discuss any questions you have with your health care provider. Document Revised: 04/18/2019 Document Reviewed: 07/14/2018 Elsevier Patient Education  2021 [employer].

## 2020-11-27 NOTE — Progress Notes (Signed)
Established Patient Office Visit  Subjective:  Patient ID: Justin Bowen, male    DOB: March 30, 1961  Age: 60 y.o. MRN: [id number]  CC:  Chief Complaint  Patient presents with  . Annual Exam    CPE, no concerns. Patient fasting.     HPI Arthor Gorter presents for follow-up of current medical problems and a physical exam.  Continues tamsulosin for BPH with good effect.  Continues to take vitamin D for vitamin D deficiency.  Taking metformin for insulin resistance.  With his wife's help he has been making changes in his diet to help lose weight.  He has been to weight loss management in the past.  He does not after surgery at this time.  He continues to renovate a home up in [facility] and is in the process of building a second as well.  Past Medical History:  Diagnosis Date  . BPH (benign prostatic hyperplasia)   . Chicken pox   . Fatigue   . Heartburn   . Knee pain   . Leg edema     Past Surgical History:  Procedure Laterality Date  . URETHRA SURGERY      Family History  Problem Relation Age of Onset  . Stroke Father 29  . Cancer Brother        brain cancer   . Cancer Paternal Grandmother 82       pancreatic cancer     Social History   Socioeconomic History  . Marital status: Married    Spouse name: Peace Jost  . Number of children: Not on file  . Years of education: Not on file  . Highest education level: Not on file  Occupational History  . Not on file  Tobacco Use  . Smoking status: Never Smoker  . Smokeless tobacco: Never Used  Vaping Use  . Vaping Use: Never used  Substance and Sexual Activity  . Alcohol use: Never  . Drug use: Never  . Sexual activity: Not on file  Other Topics Concern  . 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 chlorthalidone (HYGROTON) 25 MG tablet Take 0.5 tablets (12.5 mg total) by mouth daily. 90 tablet 1  . Cyanocobalamin (B-12 PO) Take by mouth.    . metFORMIN (GLUCOPHAGE) 500 MG tablet TAKE 1 TABLET BY MOUTH ONCE DAILY WITH BREAKFAST 90 tablet 1  . Multiple Vitamins-Minerals (MULTIVITAMIN WITH MINERALS) tablet Take 1 tablet by mouth daily.    . tamsulosin (FLOMAX) 0.4 MG CAPS capsule TAKE 1 CAPSULE BY MOUTH ONCE DAILY 90 capsule 0  . Vitamin D, Ergocalciferol, (DRISDOL) 1.25 MG (50000 UNIT) CAPS capsule TAKE 1 CAPSULE BY MOUTH EVERY 7 DAYS. 8 capsule 3  . metFORMIN (GLUCOPHAGE) 500 MG tablet TAKE 1 TABLET BY MOUTH ONCE DAILY WITH BREAKFAST 90 tablet 0  . tamsulosin (FLOMAX) 0.4 MG CAPS capsule TAKE 1 CAPSULE BY MOUTH ONCE DAILY 90 capsule 0  . metFORMIN (GLUCOPHAGE) 500 MG tablet TAKE 1 TABLET BY MOUTH ONCE DAILY WITH BREAKFAST 90 tablet 0  . metFORMIN (GLUCOPHAGE)  500 MG tablet TAKE 1 TABLET BY MOUTH ONCE DAILY WITH BREAKFAST 90 tablet 0  . tamsulosin (FLOMAX) 0.4 MG CAPS capsule TAKE 1 CAPSULE BY MOUTH ONCE DAILY 90 capsule 0   No facility-administered medications prior to visit.    Allergies  Allergen Reactions  . Sulfa Antibiotics Rash    ROS Review of Systems  Constitutional: Negative.   HENT: Negative.   Eyes: Negative for photophobia and visual disturbance.  Respiratory: Negative.   Cardiovascular: Negative.   Gastrointestinal: Negative.   Endocrine: Negative for polyphagia and polyuria.  Genitourinary: Negative.   Musculoskeletal: Negative for gait problem and joint swelling.  Neurological: Negative for speech difficulty and weakness.  Psychiatric/Behavioral: Negative.       Objective:    Physical Exam Vitals and nursing note reviewed.  Constitutional:      General: He is not in acute  distress.    Appearance: Normal appearance. He is obese. He is not ill-appearing, toxic-appearing or diaphoretic.  HENT:     Head: Normocephalic and atraumatic.     Right Ear: Tympanic membrane, ear canal and external ear normal.     Left Ear: Tympanic membrane, ear canal and external ear normal.     Mouth/Throat:     Mouth: Mucous membranes are moist.     Pharynx: Oropharynx is clear. No oropharyngeal exudate or posterior oropharyngeal erythema.  Eyes:     General: No scleral icterus.       Right eye: No discharge.        Left eye: No discharge.     Conjunctiva/sclera: Conjunctivae normal.     Pupils: Pupils are equal, round, and reactive to light.  Neck:     Vascular: No carotid bruit.  Cardiovascular:     Rate and Rhythm: Normal rate and regular rhythm.  Pulmonary:     Effort: Pulmonary effort is normal.     Breath sounds: Normal breath sounds.  Abdominal:     General: Abdomen is flat. Bowel sounds are normal. There is no distension.     Palpations: Abdomen is soft. There is no mass.     Tenderness: There is no abdominal tenderness. There is no rebound.     Hernia: No hernia is present.  Genitourinary:   Musculoskeletal:     Cervical back: No rigidity or tenderness.  Lymphadenopathy:     Cervical: No cervical adenopathy.  Skin:    General: Skin is warm and dry.  Neurological:     Mental Status: He is alert and oriented to person, place, and time.  Psychiatric:        Mood and Affect: Mood normal.        Behavior: Behavior normal.     BP 130/76   Pulse 64   Temp (!) 97.3 F (36.3 C) (Temporal)   Ht 6\' 1"  (1.854 m)   Wt (!) 348 lb 3.2 oz (157.9 kg)   SpO2 96%   BMI 45.94 kg/m  Wt Readings from Last 3 Encounters:  11/27/20 (!) 348 lb 3.2 oz (157.9 kg)  05/24/20 (!) 354 lb 3.2 oz (160.7 kg)  02/27/20 (!) 340 lb (154.2 kg)     Health Maintenance Due  Topic Date Due  . COLONOSCOPY (Pts 45-24yrs Insurance coverage will need to be confirmed)  Never done     There are no preventive care reminders to display for this patient.  Lab Results  Component Value Date   TSH 2.180 04/21/2018   Lab Results  Component Value Date  WBC 7.7 10/23/2019   HGB 14.7 10/23/2019   HCT 43.0 10/23/2019   MCV 85.4 10/23/2019   PLT 259.0 10/23/2019   Lab Results  Component Value Date   NA 140 05/24/2020   K 4.0 05/24/2020   CO2 24 05/24/2020   GLUCOSE 92 05/24/2020   BUN 17 05/24/2020   CREATININE 1.21 05/24/2020   BILITOT 0.5 10/23/2019   ALKPHOS 102 10/23/2019   AST 12 10/23/2019   ALT 16 10/23/2019   PROT 7.1 10/23/2019   ALBUMIN 4.0 10/23/2019   CALCIUM 9.4 05/24/2020   GFR 67.69 10/23/2019   Lab Results  Component Value Date   CHOL 162 10/23/2019   Lab Results  Component Value Date   HDL 35.50 (L) 10/23/2019   Lab Results  Component Value Date   LDLCALC 105 (H) 10/05/2018   Lab Results  Component Value Date   TRIG 232.0 (H) 10/23/2019   Lab Results  Component Value Date   CHOLHDL 5 10/23/2019   Lab Results  Component Value Date   HGBA1C 5.2 05/24/2020      Assessment & Plan:   Problem List Items Addressed This Visit      Endocrine   Insulin resistance   Relevant Orders   Hemoglobin A1c     Other   Benign prostatic hyperplasia with weak urinary stream   Relevant Orders   Urinalysis, Routine w reflex microscopic   Class 3 severe obesity with serious comorbidity and body mass index (BMI) of 40.0 to 44.9 in adult [facility])   Healthcare maintenance - Primary   Relevant Orders   Comprehensive metabolic panel   CBC   Lipid panel   PSA   Vitamin D deficiency   Relevant Orders   VITAMIN D 25 Hydroxy (Vit-D Deficiency, Fractures)   At risk for diabetes mellitus   Relevant Orders   Comprehensive metabolic panel   CBC   Hemoglobin A1c      No orders of the defined types were placed in this encounter.   Follow-up: Return in about 6 months (around 05/29/2021).   Patient declines colonoscopy.  He has been  given information for Cologuard.  He has not gotten around to calling and checking on coverage.  He said that his wife had done it for her brother) if she could do it around.  He seems to have a resistance to being checked for colon cancer and I reminded him that this was for his benefit.  He told me that we all have to die at some point.  He is not concerned about it.  He had indicated all zeros on his PHQ-9 today.  I asked him if that was truly the case and he said yes.  He refused prostate exam today.  He thanked me for my concern. Mliss Sax, MD

## 2020-11-28 LAB — URINALYSIS, ROUTINE W REFLEX MICROSCOPIC
Bilirubin Urine: NEGATIVE
Ketones, ur: NEGATIVE
Nitrite: NEGATIVE
Specific Gravity, Urine: 1.015 (ref 1.000–1.030)
Total Protein, Urine: NEGATIVE
Urine Glucose: NEGATIVE
Urobilinogen, UA: 0.2 (ref 0.0–1.0)
pH: 5.5 (ref 5.0–8.0)

## 2020-12-03 ENCOUNTER — Encounter: Payer: Self-pay | Admitting: Family Medicine

## 2020-12-06 ENCOUNTER — Other Ambulatory Visit (HOSPITAL_COMMUNITY): Payer: Self-pay

## 2020-12-06 ENCOUNTER — Other Ambulatory Visit: Payer: Self-pay

## 2020-12-06 ENCOUNTER — Encounter: Payer: Self-pay | Admitting: Family Medicine

## 2020-12-06 DIAGNOSIS — R609 Edema, unspecified: Secondary | ICD-10-CM

## 2020-12-06 DIAGNOSIS — R030 Elevated blood-pressure reading, without diagnosis of hypertension: Secondary | ICD-10-CM

## 2020-12-06 MED ORDER — TAMSULOSIN HCL 0.4 MG PO CAPS
ORAL_CAPSULE | Freq: Every day | ORAL | 0 refills | Status: DC
  Filled 2020-12-06: qty 90, 90d supply, fill #0

## 2020-12-06 MED ORDER — CHLORTHALIDONE 25 MG PO TABS
12.5000 mg | ORAL_TABLET | Freq: Every day | ORAL | 1 refills | Status: DC
  Filled 2020-12-06: qty 45, 90d supply, fill #0
  Filled 2021-03-10: qty 45, 90d supply, fill #1

## 2020-12-06 MED FILL — Metformin HCl Tab 500 MG: ORAL | 90 days supply | Qty: 90 | Fill #0 | Status: AC

## 2020-12-10 ENCOUNTER — Other Ambulatory Visit (HOSPITAL_COMMUNITY): Payer: Self-pay

## 2021-03-10 ENCOUNTER — Encounter: Payer: Self-pay | Admitting: Family Medicine

## 2021-03-10 ENCOUNTER — Other Ambulatory Visit (HOSPITAL_COMMUNITY): Payer: Self-pay

## 2021-03-10 ENCOUNTER — Other Ambulatory Visit: Payer: Self-pay | Admitting: Family Medicine

## 2021-03-10 DIAGNOSIS — R030 Elevated blood-pressure reading, without diagnosis of hypertension: Secondary | ICD-10-CM

## 2021-03-10 DIAGNOSIS — E559 Vitamin D deficiency, unspecified: Secondary | ICD-10-CM

## 2021-03-10 DIAGNOSIS — R609 Edema, unspecified: Secondary | ICD-10-CM

## 2021-03-10 DIAGNOSIS — M25569 Pain in unspecified knee: Secondary | ICD-10-CM

## 2021-03-10 MED ORDER — DICLOFENAC SODIUM 1 % EX GEL
2.0000 g | Freq: Three times a day (TID) | CUTANEOUS | 0 refills | Status: DC | PRN
  Filled 2021-03-10: qty 100, 16d supply, fill #0

## 2021-03-10 MED ORDER — METFORMIN HCL 500 MG PO TABS
ORAL_TABLET | Freq: Every day | ORAL | 1 refills | Status: DC
  Filled 2021-03-10: qty 90, fill #0
  Filled 2021-06-11: qty 90, 90d supply, fill #0
  Filled 2021-09-13: qty 90, 90d supply, fill #1

## 2021-03-10 MED ORDER — CHLORTHALIDONE 25 MG PO TABS
12.5000 mg | ORAL_TABLET | Freq: Every day | ORAL | 1 refills | Status: DC
  Filled 2021-03-10: qty 90, 180d supply, fill #0

## 2021-03-10 MED ORDER — TAMSULOSIN HCL 0.4 MG PO CAPS
ORAL_CAPSULE | Freq: Every day | ORAL | 1 refills | Status: DC
  Filled 2021-03-10: qty 90, fill #0
  Filled 2021-06-11: qty 90, 90d supply, fill #0
  Filled 2021-09-13: qty 90, 90d supply, fill #1

## 2021-03-10 MED ORDER — TAMSULOSIN HCL 0.4 MG PO CAPS
ORAL_CAPSULE | Freq: Every day | ORAL | 0 refills | Status: DC
  Filled 2021-03-10: qty 90, 90d supply, fill #0

## 2021-03-10 MED ORDER — METFORMIN HCL 500 MG PO TABS
ORAL_TABLET | Freq: Every day | ORAL | 1 refills | Status: DC
  Filled 2021-03-10: qty 90, 90d supply, fill #0

## 2021-03-10 MED ORDER — VITAMIN D (ERGOCALCIFEROL) 1.25 MG (50000 UNIT) PO CAPS
50000.0000 [IU] | ORAL_CAPSULE | ORAL | 1 refills | Status: DC
  Filled 2021-03-10: qty 12, 84d supply, fill #0
  Filled 2021-06-11: qty 12, 84d supply, fill #1

## 2021-03-10 NOTE — Addendum Note (Signed)
Addended by: Andrez Grime on: 03/10/2021 12:32 PM   Modules accepted: Orders

## 2021-03-10 NOTE — Telephone Encounter (Signed)
Lat seen 11/27/20 Notes says to schedule f/u around 05/29/21, no appt made at this time.

## 2021-05-26 ENCOUNTER — Ambulatory Visit (INDEPENDENT_AMBULATORY_CARE_PROVIDER_SITE_OTHER): Payer: No Typology Code available for payment source | Admitting: Nurse Practitioner

## 2021-05-26 ENCOUNTER — Other Ambulatory Visit: Payer: Self-pay

## 2021-05-26 VITALS — BP 118/78 | HR 79 | Temp 98.3°F | Resp 14 | Ht 73.0 in | Wt 350.1 lb

## 2021-05-26 DIAGNOSIS — M25531 Pain in right wrist: Secondary | ICD-10-CM | POA: Insufficient documentation

## 2021-05-26 LAB — CBC WITH DIFFERENTIAL/PLATELET
Basophils Absolute: 0 10*3/uL (ref 0.0–0.1)
Basophils Relative: 0.5 % (ref 0.0–3.0)
Eosinophils Absolute: 0.1 10*3/uL (ref 0.0–0.7)
Eosinophils Relative: 1.3 % (ref 0.0–5.0)
HCT: 41.6 % (ref 39.0–52.0)
Hemoglobin: 14.1 g/dL (ref 13.0–17.0)
Lymphocytes Relative: 17.2 % (ref 12.0–46.0)
Lymphs Abs: 1.4 10*3/uL (ref 0.7–4.0)
MCHC: 33.9 g/dL (ref 30.0–36.0)
MCV: 84.4 fl (ref 78.0–100.0)
Monocytes Absolute: 0.6 10*3/uL (ref 0.1–1.0)
Monocytes Relative: 7 % (ref 3.0–12.0)
Neutro Abs: 6 10*3/uL (ref 1.4–7.7)
Neutrophils Relative %: 74 % (ref 43.0–77.0)
Platelets: 269 10*3/uL (ref 150.0–400.0)
RBC: 4.93 Mil/uL (ref 4.22–5.81)
RDW: 14.5 % (ref 11.5–15.5)
WBC: 8.1 10*3/uL (ref 4.0–10.5)

## 2021-05-26 LAB — URIC ACID: Uric Acid, Serum: 11 mg/dL — ABNORMAL HIGH (ref 4.0–7.8)

## 2021-05-26 LAB — BASIC METABOLIC PANEL
BUN: 19 mg/dL (ref 6–23)
CO2: 25 mEq/L (ref 19–32)
Calcium: 9.1 mg/dL (ref 8.4–10.5)
Chloride: 105 mEq/L (ref 96–112)
Creatinine, Ser: 1.18 mg/dL (ref 0.40–1.50)
GFR: 66.8 mL/min (ref >60.00)
Glucose, Bld: 93 mg/dL (ref 70–99)
Potassium: 3.5 mEq/L (ref 3.5–5.1)
Sodium: 139 mEq/L (ref 135–145)

## 2021-05-26 NOTE — Patient Instructions (Signed)
It looks local to me, meaning on the skin. I am checking a red and white blood cell count, uric acid (gout) and electrolytes. I am thinking more along the lines of cellulitis. I will contact you with lab results by end of day tomorrow.

## 2021-05-26 NOTE — Progress Notes (Signed)
Acute Office Visit  Subjective:    Patient ID: Justin Bowen, male    DOB: 1961-04-08, 60 y.o.   MRN: [id number]  Chief Complaint  Patient presents with   Mass    Lump on right wrist, noticed on 05/24/21, pain is present, pain in right thumb now also, not able to open up his right hand as well as the left hand, pain has become more intense now.    HPI Patient is in today for Mass  States noticed Saturday morning noticed some bumps. Wife who is an Charity fundraiser took a look at it. It has gotten worse.  Has not tried OTC medications No injury to wrist. Does wear watch on that side (right) he is left hand dominant   Past Medical History:  Diagnosis Date   BPH (benign prostatic hyperplasia)    Chicken pox    Fatigue    Heartburn    Knee pain    Leg edema     Past Surgical History:  Procedure Laterality Date   URETHRA SURGERY      Family History  Problem Relation Age of Onset   Stroke Father 36   Cancer Brother        brain cancer    Cancer Paternal Grandmother 26       pancreatic cancer     Social History   Socioeconomic History   Marital status: Married    Spouse name: Latasha Puskas   Number of children: Not on file   Years of education: Not on file   Highest education level: Not on file  Occupational History   Not on file  Tobacco Use   Smoking status: Never   Smokeless tobacco: Never  Vaping Use   Vaping Use: Never used  Substance and Sexual Activity   Alcohol use: Never   Drug use: Never   Sexual activity: Not on file  Other Topics Concern   Not on file  Social History Narrative   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 90 tablet 1   Cyanocobalamin (B-12 PO) Take by mouth.     diclofenac Sodium (VOLTAREN) 1 % GEL Apply 2 grams topically 3 times daily as needed. 100 g 0   metFORMIN (GLUCOPHAGE) 500 MG tablet TAKE 1 TABLET BY MOUTH ONCE DAILY WITH BREAKFAST 90 tablet 1   Multiple Vitamins-Minerals (MULTIVITAMIN WITH MINERALS) tablet Take 1 tablet by mouth daily.     tamsulosin (FLOMAX) 0.4 MG CAPS capsule TAKE 1 CAPSULE BY MOUTH ONCE DAILY 90 capsule 1   Vitamin D, Ergocalciferol, (DRISDOL) 1.25 MG (50000 UNIT) CAPS capsule Take 1 capsule (50,000 Units total) by mouth every 7 (seven) days. 12 capsule 1   No facility-administered medications prior to visit.    Allergies  Allergen Reactions   Sulfa Antibiotics Rash    Review of Systems  Constitutional:  Negative for chills and fever.  Respiratory:  Negative for shortness of breath.   Cardiovascular:  Negative for chest pain.  Gastrointestinal:  Negative for  diarrhea, nausea and vomiting.  Musculoskeletal:  Positive for arthralgias.  Skin:  Positive for color change and rash.      Objective:    Physical Exam Vitals and nursing note reviewed.  Constitutional:      Appearance: He is obese.  Cardiovascular:     Rate and Rhythm: Normal rate and regular rhythm.  Pulmonary:     Breath sounds: Normal breath sounds.  Abdominal:     General: Bowel sounds are normal.  Skin:    General: Skin is warm.     Capillary Refill: Capillary refill takes less than 2 seconds.     Findings: Erythema and rash present.          Comments: Redness and warmth to anterior lateral wrist.  See photo   Neurological:     General: No focal deficit present.     Mental Status: He is alert.     Sensory: Sensation is intact.     Comments: Pain in right wrist with active  extension and lateral movement. 2+ radial pulses 5/5 strength fingers on bilateral upper extremity      BP 118/78   Pulse 79   Temp 98.3 F (36.8 C)   Resp 14   Ht 6\' 1"  (1.854 m)   Wt (!) 350 lb 2 oz (158.8 kg)   SpO2 98%   BMI 46.19 kg/m  Wt Readings from Last 3 Encounters:  05/26/21 (!) 350 lb 2 oz (158.8 kg)  11/27/20 (!) 348 lb 3.2 oz (157.9 kg)  05/24/20 (!) 354 lb 3.2 oz (160.7 kg)    Health Maintenance Due  Topic Date Due   COLONOSCOPY (Pts 45-26yrs Insurance coverage will need to be confirmed)  Never done   Zoster Vaccines- Shingrix (1 of 2) Never done   COVID-19 Vaccine (4 - Booster for Moderna series) 07/18/2020   INFLUENZA VACCINE  03/03/2021    There are no preventive care reminders to display for this patient.   Lab Results  Component Value Date   TSH 2.180 04/21/2018   Lab Results  Component Value Date   WBC 8.0 11/27/2020   HGB 15.6 11/27/2020   HCT 45.6 11/27/2020   MCV 85.7 11/27/2020   PLT 285.0 11/27/2020   Lab Results  Component Value Date   NA 141 11/27/2020   K 4.1 11/27/2020   CO2 24 11/27/2020   GLUCOSE 95 11/27/2020   BUN 17 11/27/2020   CREATININE 1.28 11/27/2020   BILITOT 0.6 11/27/2020   ALKPHOS 116 11/27/2020   AST 12 11/27/2020   ALT 16 11/27/2020   PROT 7.4 11/27/2020   ALBUMIN 4.2 11/27/2020   CALCIUM 9.5 11/27/2020   GFR 60.80 11/27/2020   Lab Results  Component Value Date   CHOL 163 11/27/2020   Lab Results  Component Value Date   HDL 38.00 (L) 11/27/2020   Lab Results  Component Value Date   LDLCALC 89 11/27/2020   Lab Results  Component Value Date   TRIG 181.0 (H) 11/27/2020   Lab Results  Component Value Date   CHOLHDL 4 11/27/2020   Lab Results  Component Value Date   HGBA1C 5.1 11/27/2020       Assessment & Plan:   Problem List Items Addressed This Visit       Other   Right wrist pain - Primary    Acute nature without injury.  Patient does wear watch on that wrist.  Does look reactive in nature and some insect may have stung or bit  him and caused inflammation.  Leaning more towards  that or beginnings of a cellulitis.  Pending lab results we will do CBC with differential BMP and uric acid as patient is on chlorthalidone daily.  Did offer to x-ray but at this setting without injury low likely hood it will give insight.  If labs look benign we can always pursue x-ray patient agreed to care plan.  Pending lab results continue to monitor.  Signs and symptoms reviewed when he needs to be seen emergently.  Patient acknowledged      Relevant Orders   CBC with Differential/Platelet   Basic metabolic panel   Uric acid     No orders of the defined types were placed in this encounter.  This visit occurred during the SARS-CoV-2 public health emergency.  Safety protocols were in place, including screening questions prior to the visit, additional usage of staff PPE, and extensive cleaning of exam room while observing appropriate contact time as indicated for disinfecting solutions.   Audria Nine, NP

## 2021-05-26 NOTE — Assessment & Plan Note (Signed)
Acute nature without injury.  Patient does wear watch on that wrist.  Does look reactive in nature and some insect may have stung or bit him and caused inflammation.  Leaning more towards that or beginnings of a cellulitis.  Pending lab results we will do CBC with differential BMP and uric acid as patient is on chlorthalidone daily.  Did offer to x-ray but at this setting without injury low likely hood it will give insight.  If labs look benign we can always pursue x-ray patient agreed to care plan.  Pending lab results continue to monitor.  Signs and symptoms reviewed when he needs to be seen emergently.  Patient acknowledged

## 2021-05-27 ENCOUNTER — Other Ambulatory Visit: Payer: Self-pay | Admitting: Nurse Practitioner

## 2021-05-27 ENCOUNTER — Other Ambulatory Visit (HOSPITAL_COMMUNITY): Payer: Self-pay

## 2021-05-27 ENCOUNTER — Encounter: Payer: Self-pay | Admitting: Nurse Practitioner

## 2021-05-27 DIAGNOSIS — M10231 Drug-induced gout, right wrist: Secondary | ICD-10-CM

## 2021-05-27 MED ORDER — PREDNISONE 20 MG PO TABS
ORAL_TABLET | ORAL | 0 refills | Status: AC
  Filled 2021-05-27: qty 12, 8d supply, fill #0

## 2021-05-28 ENCOUNTER — Encounter: Payer: Self-pay | Admitting: Nurse Practitioner

## 2021-06-11 ENCOUNTER — Other Ambulatory Visit (HOSPITAL_COMMUNITY): Payer: Self-pay

## 2021-06-14 ENCOUNTER — Encounter: Payer: Self-pay | Admitting: Nurse Practitioner

## 2021-06-17 ENCOUNTER — Ambulatory Visit (INDEPENDENT_AMBULATORY_CARE_PROVIDER_SITE_OTHER)
Admission: RE | Admit: 2021-06-17 | Discharge: 2021-06-17 | Disposition: A | Discharge status: Home / Self Care | Payer: No Typology Code available for payment source | Source: Ambulatory Visit | Attending: Nurse Practitioner | Admitting: Nurse Practitioner

## 2021-06-17 ENCOUNTER — Ambulatory Visit (INDEPENDENT_AMBULATORY_CARE_PROVIDER_SITE_OTHER): Payer: No Typology Code available for payment source | Admitting: Nurse Practitioner

## 2021-06-17 ENCOUNTER — Other Ambulatory Visit: Payer: Self-pay

## 2021-06-17 VITALS — BP 126/78 | HR 71 | Temp 97.9°F | Resp 16 | Ht 73.0 in | Wt 352.0 lb

## 2021-06-17 DIAGNOSIS — M7989 Other specified soft tissue disorders: Secondary | ICD-10-CM | POA: Diagnosis not present

## 2021-06-17 DIAGNOSIS — M79641 Pain in right hand: Secondary | ICD-10-CM

## 2021-06-17 LAB — URIC ACID: Uric Acid, Serum: 8.7 mg/dL — ABNORMAL HIGH (ref 4.0–7.8)

## 2021-06-17 MED ORDER — PREDNISONE 20 MG PO TABS: ORAL_TABLET | ORAL | 0 refills | Status: AC

## 2021-06-17 NOTE — Assessment & Plan Note (Signed)
No known injury and history of gout in the same hand we will recheck uric acid today and do x-ray of the hand.  Pending lab results and x-ray we will presumptively treat like we did last time with prednisone pack prescription sent to pharmacy.  Continue to monitor signs and symptoms reviewed as when to seek urgent or emergent health care.

## 2021-06-17 NOTE — Patient Instructions (Signed)
Nice to see you Will be in touch with the results of the lab and xray Sent prednisone to pharmacy take with food and avoid NSAIDs like ibuprofen, advil, motrin, naproxen, or aleve

## 2021-06-17 NOTE — Assessment & Plan Note (Signed)
Likely reactive.  Low suspicion for cellulitis although patient states he has been clearing brush outside does not wear gloves and long sleeves.  Continue to monitor start prednisone

## 2021-06-17 NOTE — Progress Notes (Signed)
Acute Office Visit  Subjective:    Patient ID: Justin Bowen, male    DOB: 05/29/1961, 60 y.o.   MRN: [id number]  Chief Complaint  Patient presents with   Hand swelling    Continues to have swelling with right hand. Prednisone helped pain and swelling. Symptoms came back about 4 to 5 days ago again.    HPI Patient is in today for hand swelling  Symptoms started approx 5 days ago. States he got concerned on firday. Has increased over time with reddness and swelling Has been clearing brush  Has taken 2 tyleonol this morning Feels like the pain is better but swelling is the same or slightly better  Throbbing that is constant and movement makes discomfort worse. He is left handed  Patient states that he has stopped taking the Chlorthalidone medication shortly after seeing me in office last visit.  Past Medical History:  Diagnosis Date   BPH (benign prostatic hyperplasia)    Chicken pox    Fatigue    Heartburn    Knee pain    Leg edema     Past Surgical History:  Procedure Laterality Date   URETHRA SURGERY      Family History  Problem Relation Age of Onset   Stroke Father 57   Cancer Brother        brain cancer    Cancer Paternal Grandmother 39       pancreatic cancer     Social History   Socioeconomic History   Marital status: Married    Spouse name: Rishon Thilges   Number of children: Not on file   Years of education: Not on file   Highest education level: Not on file  Occupational History   Not on file  Tobacco Use   Smoking status: Never   Smokeless tobacco: Never  Vaping Use   Vaping Use: Never used  Substance and Sexual Activity   Alcohol use: Never   Drug use: Never   Sexual activity: Not on file  Other Topics Concern   Not on file  Social History Narrative   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     diclofenac Sodium (VOLTAREN) 1 % GEL Apply 2 grams topically 3 times daily as needed. 100 g 0   metFORMIN (GLUCOPHAGE) 500 MG tablet TAKE 1 TABLET BY MOUTH ONCE DAILY WITH BREAKFAST 90 tablet 1   Multiple Vitamins-Minerals (MULTIVITAMIN WITH MINERALS) tablet Take 1 tablet by mouth daily.     tamsulosin (FLOMAX) 0.4 MG CAPS capsule TAKE 1 CAPSULE BY MOUTH ONCE DAILY 90 capsule 1   Vitamin D, Ergocalciferol, (DRISDOL) 1.25 MG (50000 UNIT) CAPS capsule Take 1 capsule (50,000 Units total) by mouth every 7 (seven) days. 12 capsule 1   chlorthalidone (HYGROTON) 25 MG tablet Take 0.5 tablets (12.5 mg total) by mouth daily. (Patient not taking: Reported on 06/17/2021) 90 tablet 1   No facility-administered medications prior to visit.    Allergies  Allergen Reactions   Sulfa Antibiotics Rash    Review of Systems  Constitutional:  Negative for chills and fever.  Respiratory:  Negative for cough and shortness of breath.   Cardiovascular:  Negative for chest pain.  Gastrointestinal:  Negative for diarrhea, nausea and vomiting.  Musculoskeletal:  Positive for arthralgias and joint swelling.  Skin:  Positive for color change.  Neurological:  Negative for numbness.      Objective:    Physical Exam Vitals and nursing note reviewed.  Constitutional:      Appearance: He is obese.  Cardiovascular:     Rate and Rhythm: Normal rate and regular rhythm.     Pulses:          Radial pulses are 2+ on the right side and 2+ on the left side.  Pulmonary:     Effort: Pulmonary effort is normal.     Breath sounds: Normal breath sounds.  Abdominal:     General: Bowel sounds are normal.  Musculoskeletal:        General: Swelling and tenderness  present. No deformity or signs of injury.       Arms:     Comments: Tenderness to index finger and MCP joint and middle finger MCP joint.  Mild erythema Moderate edema. Neurovascularly intact Difficulty closing hand due to swelling  Skin:    General: Skin is warm.     Capillary Refill: Capillary refill takes less than 2 seconds.     Findings: Erythema present. No rash.  Neurological:     General: No focal deficit present.     Mental Status: He is alert. Mental status is at baseline.    BP 126/78   Pulse 71   Temp 97.9 F (36.6 C)   Resp 16   Ht 6\' 1"  (1.854 m)   Wt (!) 352 lb (159.7 kg)   SpO2 99%   BMI 46.44 kg/m  Wt Readings from Last 3 Encounters:  06/17/21 (!) 352 lb (159.7 kg)  05/26/21 (!) 350 lb 2 oz (158.8 kg)  11/27/20 (!) 348 lb 3.2 oz (157.9 kg)    Health Maintenance Due  Topic Date Due   COLONOSCOPY (Pts 45-67yrs Insurance coverage will need to be confirmed)  Never done   Zoster Vaccines- Shingrix (1 of 2) Never done   COVID-19 Vaccine (4 - Booster for Moderna series) 07/18/2020   INFLUENZA VACCINE  03/03/2021    There are no preventive care reminders to display for this patient.   Lab Results  Component Value Date   TSH 2.180 04/21/2018   Lab Results  Component Value Date   WBC 8.1 05/26/2021   HGB 14.1 05/26/2021   HCT 41.6 05/26/2021   MCV 84.4 05/26/2021   PLT 269.0 05/26/2021   Lab Results  Component Value Date   NA 139 05/26/2021   K 3.5 05/26/2021   CO2 25 05/26/2021   GLUCOSE 93 05/26/2021   BUN 19 05/26/2021   CREATININE 1.18 05/26/2021   BILITOT 0.6 11/27/2020   ALKPHOS 116 11/27/2020   AST 12 11/27/2020   ALT 16 11/27/2020   PROT 7.4 11/27/2020   ALBUMIN 4.2 11/27/2020   CALCIUM 9.1 05/26/2021   GFR 66.80 05/26/2021   Lab Results  Component Value Date   CHOL 163 11/27/2020   Lab Results  Component Value Date   HDL 38.00 (L) 11/27/2020   Lab Results  Component Value Date   LDLCALC 89 11/27/2020   Lab Results   Component Value Date   TRIG 181.0 (H) 11/27/2020   Lab Results  Component Value Date  CHOLHDL 4 11/27/2020   Lab Results  Component Value Date   HGBA1C 5.1 11/27/2020       Assessment & Plan:   Problem List Items Addressed This Visit       Other   Swelling of right hand - Primary    Likely reactive.  Low suspicion for cellulitis although patient states he has been clearing brush outside does not wear gloves and long sleeves.  Continue to monitor start prednisone      Relevant Medications   predniSONE (DELTASONE) 20 MG tablet   Other Relevant Orders   Uric Acid   DG Hand Complete Right (Completed)   Right hand pain    No known injury and history of gout in the same hand we will recheck uric acid today and do x-ray of the hand.  Pending lab results and x-ray we will presumptively treat like we did last time with prednisone pack prescription sent to pharmacy.  Continue to monitor signs and symptoms reviewed as when to seek urgent or emergent health care.      Relevant Medications   predniSONE (DELTASONE) 20 MG tablet   Other Relevant Orders   Uric Acid   DG Hand Complete Right (Completed)     No orders of the defined types were placed in this encounter.  This visit occurred during the SARS-CoV-2 public health emergency.  Safety protocols were in place, including screening questions prior to the visit, additional usage of staff PPE, and extensive cleaning of exam room while observing appropriate contact time as indicated for disinfecting solutions.   Audria Nine, NP

## 2021-06-24 ENCOUNTER — Encounter: Payer: Self-pay | Admitting: Nurse Practitioner

## 2021-09-09 ENCOUNTER — Other Ambulatory Visit (HOSPITAL_COMMUNITY): Payer: Self-pay

## 2021-09-13 ENCOUNTER — Other Ambulatory Visit (HOSPITAL_COMMUNITY): Payer: Self-pay

## 2021-12-10 ENCOUNTER — Other Ambulatory Visit: Payer: Self-pay | Admitting: Family Medicine

## 2021-12-10 ENCOUNTER — Other Ambulatory Visit (HOSPITAL_COMMUNITY): Payer: Self-pay

## 2021-12-10 MED ORDER — METFORMIN HCL 500 MG PO TABS
ORAL_TABLET | Freq: Every day | ORAL | 0 refills | Status: DC
  Filled 2021-12-10: qty 90, 90d supply, fill #0

## 2021-12-10 MED ORDER — TAMSULOSIN HCL 0.4 MG PO CAPS
ORAL_CAPSULE | Freq: Every day | ORAL | 0 refills | Status: DC
  Filled 2021-12-10: qty 90, 90d supply, fill #0

## 2021-12-11 ENCOUNTER — Other Ambulatory Visit (HOSPITAL_COMMUNITY): Payer: Self-pay

## 2022-02-04 ENCOUNTER — Other Ambulatory Visit (HOSPITAL_COMMUNITY): Payer: Self-pay

## 2022-03-11 ENCOUNTER — Other Ambulatory Visit: Payer: Self-pay | Admitting: Family Medicine

## 2022-03-11 ENCOUNTER — Other Ambulatory Visit (HOSPITAL_COMMUNITY): Payer: Self-pay

## 2022-03-11 MED ORDER — TAMSULOSIN HCL 0.4 MG PO CAPS
ORAL_CAPSULE | Freq: Every day | ORAL | 0 refills | Status: DC
  Filled 2022-03-11: qty 90, 90d supply, fill #0

## 2022-03-11 MED ORDER — METFORMIN HCL 500 MG PO TABS
ORAL_TABLET | Freq: Every day | ORAL | 0 refills | Status: DC
  Filled 2022-03-11: qty 90, 90d supply, fill #0

## 2022-04-07 ENCOUNTER — Other Ambulatory Visit (HOSPITAL_COMMUNITY): Payer: Self-pay

## 2022-04-14 ENCOUNTER — Encounter: Payer: Self-pay | Admitting: Family Medicine

## 2022-04-14 ENCOUNTER — Ambulatory Visit (INDEPENDENT_AMBULATORY_CARE_PROVIDER_SITE_OTHER): Payer: No Typology Code available for payment source | Admitting: Family Medicine

## 2022-04-14 VITALS — BP 131/85 | HR 65 | Temp 97.6°F | Ht 73.0 in | Wt 354.2 lb

## 2022-04-14 DIAGNOSIS — Z0000 Encounter for general adult medical examination without abnormal findings: Secondary | ICD-10-CM

## 2022-04-14 DIAGNOSIS — Z9189 Other specified personal risk factors, not elsewhere classified: Secondary | ICD-10-CM | POA: Diagnosis not present

## 2022-04-14 DIAGNOSIS — R609 Edema, unspecified: Secondary | ICD-10-CM

## 2022-04-14 DIAGNOSIS — N401 Enlarged prostate with lower urinary tract symptoms: Secondary | ICD-10-CM | POA: Diagnosis not present

## 2022-04-14 DIAGNOSIS — R3912 Poor urinary stream: Secondary | ICD-10-CM

## 2022-04-14 DIAGNOSIS — R030 Elevated blood-pressure reading, without diagnosis of hypertension: Secondary | ICD-10-CM | POA: Diagnosis not present

## 2022-04-14 DIAGNOSIS — E559 Vitamin D deficiency, unspecified: Secondary | ICD-10-CM | POA: Diagnosis not present

## 2022-04-14 LAB — CBC
HCT: 41.7 % (ref 39.0–52.0)
Hemoglobin: 14.3 g/dL (ref 13.0–17.0)
MCHC: 34.3 g/dL (ref 30.0–36.0)
MCV: 84.6 fl (ref 78.0–100.0)
Platelets: 257 10*3/uL (ref 150.0–400.0)
RBC: 4.93 Mil/uL (ref 4.22–5.81)
RDW: 14.7 % (ref 11.5–15.5)
WBC: 7.5 10*3/uL (ref 4.0–10.5)

## 2022-04-14 LAB — HEMOGLOBIN A1C: Hgb A1c MFr Bld: 5.3 % (ref 4.6–6.5)

## 2022-04-14 LAB — COMPREHENSIVE METABOLIC PANEL
ALT: 15 U/L (ref 0–53)
AST: 12 U/L (ref 0–37)
Albumin: 3.9 g/dL (ref 3.5–5.2)
Alkaline Phosphatase: 106 U/L (ref 39–117)
BUN: 19 mg/dL (ref 6–23)
CO2: 24 mEq/L (ref 19–32)
Calcium: 9 mg/dL (ref 8.4–10.5)
Chloride: 107 mEq/L (ref 96–112)
Creatinine, Ser: 1.2 mg/dL (ref 0.40–1.50)
GFR: 65.07 mL/min (ref >60.00)
Glucose, Bld: 93 mg/dL (ref 70–99)
Potassium: 4.5 mEq/L (ref 3.5–5.1)
Sodium: 141 mEq/L (ref 135–145)
Total Bilirubin: 0.6 mg/dL (ref 0.2–1.2)
Total Protein: 7 g/dL (ref 6.0–8.3)

## 2022-04-14 LAB — LIPID PANEL
Cholesterol: 144 mg/dL (ref 0–200)
HDL: 36.8 mg/dL — ABNORMAL LOW (ref >39.00)
LDL Cholesterol: 82 mg/dL (ref 0–99)
NonHDL: 106.98
Total CHOL/HDL Ratio: 4
Triglycerides: 127 mg/dL (ref 0.0–149.0)
VLDL: 25.4 mg/dL (ref 0.0–40.0)

## 2022-04-14 LAB — URINALYSIS, ROUTINE W REFLEX MICROSCOPIC
Bilirubin Urine: NEGATIVE
Ketones, ur: NEGATIVE
Nitrite: NEGATIVE
RBC / HPF: NONE SEEN (ref 0–?)
Specific Gravity, Urine: 1.02 (ref 1.000–1.030)
Total Protein, Urine: NEGATIVE
Urine Glucose: NEGATIVE
Urobilinogen, UA: 0.2 (ref 0.0–1.0)
pH: 6 (ref 5.0–8.0)

## 2022-04-14 LAB — VITAMIN D 25 HYDROXY (VIT D DEFICIENCY, FRACTURES): VITD: 51.32 ng/mL (ref 30.00–100.00)

## 2022-04-14 LAB — PSA: PSA: 0.95 ng/mL (ref 0.10–4.00)

## 2022-04-14 NOTE — Progress Notes (Signed)
Established Patient Office Visit  Subjective   Patient ID: Kenyada Bowen, male    DOB: 12-Apr-1961  Age: 61 y.o. MRN: [id number]  Chief Complaint  Patient presents with   Annual Exam    CPE no concerns. Pt fasting    HPI for CPE and follow-up of elevated blood pressure with lower extremity edema, at risk for diabetes, and vitamin D deficiency.  Continues with chlorthalidone for lower extremity edema and blood pressure control.  Continues with low-dose metformin.  He is taking 5000 units of vitamin D daily.  He has no regular dental care.  He has no regular exercise.     Review of Systems  Constitutional: Negative.   HENT: Negative.    Eyes:  Negative for blurred vision, discharge and redness.  Respiratory: Negative.    Cardiovascular: Negative.   Gastrointestinal:  Negative for abdominal pain, blood in stool, constipation, diarrhea and melena.  Genitourinary: Negative.   Musculoskeletal: Negative.  Negative for myalgias.  Skin:  Negative for rash.  Neurological:  Negative for tingling, loss of consciousness and weakness.  Endo/Heme/Allergies:  Negative for polydipsia.      04/14/2022   10:25 AM 11/27/2020   11:19 AM 11/27/2020   10:07 AM  Depression screen PHQ 2/9  Decreased Interest 0 0 0  Down, Depressed, Hopeless 0 0 0  PHQ - 2 Score 0 0 0  Altered sleeping  0   Tired, decreased energy  0   Change in appetite  0   Feeling bad or failure about yourself   0   Trouble concentrating  0   Moving slowly or fidgety/restless  0   Suicidal thoughts  0   PHQ-9 Score  0   Difficult doing work/chores  Not difficult at all         Objective:     BP 131/85 (BP Location: Left Arm, Patient Position: Sitting, Cuff Size: Large)   Pulse 65   Temp 97.6 F (36.4 C) (Temporal)   Ht 6\' 1"  (1.854 m)   Wt (!) 354 lb 3.2 oz (160.7 kg)   SpO2 97%   BMI 46.73 kg/m  BP Readings from Last 3 Encounters:  04/14/22 131/85  06/17/21 126/78  05/26/21 118/78   Wt Readings from Last 3  Encounters:  04/14/22 (!) 354 lb 3.2 oz (160.7 kg)  06/17/21 (!) 352 lb (159.7 kg)  05/26/21 (!) 350 lb 2 oz (158.8 kg)      Physical Exam Constitutional:      General: He is not in acute distress.    Appearance: Normal appearance. He is not ill-appearing, toxic-appearing or diaphoretic.  HENT:     Head: Normocephalic and atraumatic.     Right Ear: External ear normal.     Left Ear: External ear normal.     Mouth/Throat:     Mouth: Mucous membranes are moist.     Pharynx: Oropharynx is clear. No oropharyngeal exudate or posterior oropharyngeal erythema.  Eyes:     General: No scleral icterus.       Right eye: No discharge.        Left eye: No discharge.     Extraocular Movements: Extraocular movements intact.     Conjunctiva/sclera: Conjunctivae normal.     Pupils: Pupils are equal, round, and reactive to light.  Cardiovascular:     Rate and Rhythm: Normal rate and regular rhythm.  Pulmonary:     Effort: Pulmonary effort is normal. No respiratory distress.     Breath sounds:  Normal breath sounds.  Abdominal:     General: Bowel sounds are normal.     Tenderness: There is no abdominal tenderness. There is no guarding.  Genitourinary:    Comments: Refuses exam  Musculoskeletal:     Cervical back: No rigidity or tenderness.  Lymphadenopathy:     Cervical: No cervical adenopathy.  Skin:    General: Skin is warm and dry.  Neurological:     Mental Status: He is alert and oriented to person, place, and time.  Psychiatric:        Mood and Affect: Mood normal.        Behavior: Behavior normal.      No results found for any visits on 04/14/22.    The 10-year ASCVD risk score (Arnett DK, et al., 2019) is: 9.8%    Assessment & Plan:   Problem List Items Addressed This Visit       Genitourinary   Benign prostatic hyperplasia with weak urinary stream   Relevant Orders   PSA     Other   Healthcare maintenance - Primary   Relevant Orders   Lipid panel    Urinalysis, Routine w reflex microscopic   Edema   Relevant Orders   Comprehensive metabolic panel   Elevated BP without diagnosis of hypertension   Relevant Orders   CBC   Comprehensive metabolic panel   Vitamin D deficiency   Relevant Orders   VITAMIN D 25 Hydroxy (Vit-D Deficiency, Fractures)   At risk for diabetes mellitus   Relevant Orders   Hemoglobin A1c    Return in about 6 months (around 10/13/2022).  Patient declines colonoscopy.  His wife is an oncology nurse and he has had this discussion with her as well.  He declines a flu vaccine and Shingrix vaccines.  He will discuss these with her.  Encouraged regular dental care twice yearly.  Encouraged regular exercise.  Walking for 30 minutes most days would be great.  Information was given on exercising to lose weight.  Mliss Sax, MD

## 2022-06-15 ENCOUNTER — Other Ambulatory Visit (HOSPITAL_COMMUNITY): Payer: Self-pay

## 2022-06-15 ENCOUNTER — Other Ambulatory Visit: Payer: Self-pay | Admitting: Family Medicine

## 2022-06-15 MED ORDER — METFORMIN HCL 500 MG PO TABS
500.0000 mg | ORAL_TABLET | Freq: Every day | ORAL | 3 refills | Status: DC
  Filled 2022-06-15: qty 90, 90d supply, fill #0
  Filled 2022-09-28: qty 90, 90d supply, fill #1
  Filled 2023-04-07: qty 90, 90d supply, fill #2

## 2022-06-15 MED ORDER — TAMSULOSIN HCL 0.4 MG PO CAPS
0.4000 mg | ORAL_CAPSULE | Freq: Every day | ORAL | 3 refills | Status: DC
  Filled 2022-06-15: qty 90, 90d supply, fill #0
  Filled 2022-09-28: qty 90, 90d supply, fill #1
  Filled 2023-01-06 (×2): qty 90, 90d supply, fill #2
  Filled 2023-04-07: qty 90, 90d supply, fill #3

## 2022-06-18 ENCOUNTER — Other Ambulatory Visit (HOSPITAL_COMMUNITY): Payer: Self-pay

## 2022-06-19 ENCOUNTER — Other Ambulatory Visit (HOSPITAL_COMMUNITY): Payer: Self-pay

## 2022-06-20 ENCOUNTER — Other Ambulatory Visit (HOSPITAL_COMMUNITY): Payer: Self-pay

## 2022-09-28 ENCOUNTER — Other Ambulatory Visit (HOSPITAL_COMMUNITY): Payer: Self-pay

## 2022-11-16 ENCOUNTER — Encounter: Payer: Self-pay | Admitting: *Deleted

## 2023-01-06 ENCOUNTER — Other Ambulatory Visit (HOSPITAL_COMMUNITY): Payer: Self-pay

## 2023-01-06 ENCOUNTER — Other Ambulatory Visit: Payer: Self-pay

## 2023-01-07 ENCOUNTER — Other Ambulatory Visit (HOSPITAL_COMMUNITY): Payer: Self-pay

## 2023-04-07 ENCOUNTER — Other Ambulatory Visit (HOSPITAL_COMMUNITY): Payer: Self-pay

## 2023-07-23 ENCOUNTER — Encounter: Payer: Self-pay | Admitting: Family Medicine

## 2023-07-27 ENCOUNTER — Ambulatory Visit: Payer: 59 | Admitting: Family Medicine

## 2023-07-27 ENCOUNTER — Encounter: Payer: Self-pay | Admitting: Family Medicine

## 2023-07-27 ENCOUNTER — Other Ambulatory Visit (HOSPITAL_COMMUNITY): Payer: Self-pay

## 2023-07-27 ENCOUNTER — Other Ambulatory Visit: Payer: Self-pay

## 2023-07-27 VITALS — BP 136/86 | HR 76 | Temp 98.0°F | Ht 73.0 in | Wt 375.6 lb

## 2023-07-27 DIAGNOSIS — Z8739 Personal history of other diseases of the musculoskeletal system and connective tissue: Secondary | ICD-10-CM | POA: Diagnosis not present

## 2023-07-27 DIAGNOSIS — R7303 Prediabetes: Secondary | ICD-10-CM | POA: Diagnosis not present

## 2023-07-27 DIAGNOSIS — N401 Enlarged prostate with lower urinary tract symptoms: Secondary | ICD-10-CM

## 2023-07-27 DIAGNOSIS — Z0000 Encounter for general adult medical examination without abnormal findings: Secondary | ICD-10-CM

## 2023-07-27 DIAGNOSIS — Z6841 Body Mass Index (BMI) 40.0 and over, adult: Secondary | ICD-10-CM

## 2023-07-27 DIAGNOSIS — R3912 Poor urinary stream: Secondary | ICD-10-CM

## 2023-07-27 DIAGNOSIS — E559 Vitamin D deficiency, unspecified: Secondary | ICD-10-CM

## 2023-07-27 DIAGNOSIS — I10 Essential (primary) hypertension: Secondary | ICD-10-CM

## 2023-07-27 DIAGNOSIS — Z1211 Encounter for screening for malignant neoplasm of colon: Secondary | ICD-10-CM

## 2023-07-27 DIAGNOSIS — Z13220 Encounter for screening for lipoid disorders: Secondary | ICD-10-CM | POA: Diagnosis not present

## 2023-07-27 DIAGNOSIS — R8290 Unspecified abnormal findings in urine: Secondary | ICD-10-CM

## 2023-07-27 LAB — VITAMIN D 25 HYDROXY (VIT D DEFICIENCY, FRACTURES): VITD: 38.94 ng/mL (ref 30.00–100.00)

## 2023-07-27 LAB — CBC WITH DIFFERENTIAL/PLATELET
Basophils Absolute: 0 10*3/uL (ref 0.0–0.1)
Basophils Relative: 0.6 % (ref 0.0–3.0)
Eosinophils Absolute: 0.2 10*3/uL (ref 0.0–0.7)
Eosinophils Relative: 2.4 % (ref 0.0–5.0)
HCT: 45.7 % (ref 39.0–52.0)
Hemoglobin: 15.7 g/dL (ref 13.0–17.0)
Lymphocytes Relative: 19.6 % (ref 12.0–46.0)
Lymphs Abs: 1.5 10*3/uL (ref 0.7–4.0)
MCHC: 34.3 g/dL (ref 30.0–36.0)
MCV: 85.2 fL (ref 78.0–100.0)
Monocytes Absolute: 0.6 10*3/uL (ref 0.1–1.0)
Monocytes Relative: 7.2 % (ref 3.0–12.0)
Neutro Abs: 5.5 10*3/uL (ref 1.4–7.7)
Neutrophils Relative %: 70.2 % (ref 43.0–77.0)
Platelets: 287 10*3/uL (ref 150.0–400.0)
RBC: 5.37 Mil/uL (ref 4.22–5.81)
RDW: 14.2 % (ref 11.5–15.5)
WBC: 7.8 10*3/uL (ref 4.0–10.5)

## 2023-07-27 LAB — URINALYSIS, ROUTINE W REFLEX MICROSCOPIC
Bilirubin Urine: NEGATIVE
Hgb urine dipstick: NEGATIVE
Ketones, ur: NEGATIVE
Nitrite: POSITIVE — AB
RBC / HPF: NONE SEEN (ref 0–?)
Specific Gravity, Urine: 1.015 (ref 1.000–1.030)
Total Protein, Urine: NEGATIVE
Urine Glucose: NEGATIVE
Urobilinogen, UA: 0.2 (ref 0.0–1.0)
pH: 6 (ref 5.0–8.0)

## 2023-07-27 LAB — HEMOGLOBIN A1C: Hgb A1c MFr Bld: 5.4 % (ref 4.6–6.5)

## 2023-07-27 LAB — COMPREHENSIVE METABOLIC PANEL
ALT: 18 U/L (ref 0–53)
AST: 13 U/L (ref 0–37)
Albumin: 4.3 g/dL (ref 3.5–5.2)
Alkaline Phosphatase: 127 U/L — ABNORMAL HIGH (ref 39–117)
BUN: 22 mg/dL (ref 6–23)
CO2: 25 meq/L (ref 19–32)
Calcium: 8.9 mg/dL (ref 8.4–10.5)
Chloride: 105 meq/L (ref 96–112)
Creatinine, Ser: 1.05 mg/dL (ref 0.40–1.50)
GFR: 75.69 mL/min (ref >60.00)
Glucose, Bld: 98 mg/dL (ref 70–99)
Potassium: 4.2 meq/L (ref 3.5–5.1)
Sodium: 140 meq/L (ref 135–145)
Total Bilirubin: 0.7 mg/dL (ref 0.2–1.2)
Total Protein: 7.3 g/dL (ref 6.0–8.3)

## 2023-07-27 LAB — PSA: PSA: 1.38 ng/mL (ref 0.10–4.00)

## 2023-07-27 LAB — LIPID PANEL
Cholesterol: 172 mg/dL (ref 0–200)
HDL: 44.1 mg/dL (ref >39.00)
LDL Cholesterol: 104 mg/dL — ABNORMAL HIGH (ref 0–99)
NonHDL: 127.83
Total CHOL/HDL Ratio: 4
Triglycerides: 120 mg/dL (ref 0.0–149.0)
VLDL: 24 mg/dL (ref 0.0–40.0)

## 2023-07-27 LAB — URIC ACID: Uric Acid, Serum: 9.2 mg/dL — ABNORMAL HIGH (ref 4.0–7.8)

## 2023-07-27 MED ORDER — SPIRONOLACTONE 25 MG PO TABS
25.0000 mg | ORAL_TABLET | Freq: Every day | ORAL | 0 refills | Status: DC
  Filled 2023-07-27: qty 90, 90d supply, fill #0

## 2023-07-27 NOTE — Progress Notes (Addendum)
Established Patient Office Visit   Subjective:  Patient ID: Justin Bowen, male    DOB: 1960-11-13  Age: 62 y.o. MRN: [id number]  Chief Complaint  Patient presents with   Annual Exam    CPE. Pt is fasting.     HPI Encounter Diagnoses  Name Primary?   Healthcare maintenance Yes   Essential hypertension    Screening for cholesterol level    Benign prostatic hyperplasia with weak urinary stream    Morbid obesity with BMI of 45.0-49.9, adult (HCC)    Screening for colon cancer    Prediabetes    History of gout    Vitamin D deficiency    Abnormal urine    Here for physical and follow-up of above.  He had discontinued chlorthalidone secondary to concern about possible gout.  Continues Flomax for BPH and it helps a great deal.  Continues metformin for prediabetes without issue.  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  He has no regular exercise.  Not seeing the dentist regularly.   Review of Systems  Constitutional: Negative.   HENT: Negative.    Eyes:  Negative for blurred vision, discharge and redness.  Respiratory: Negative.    Cardiovascular: Negative.   Gastrointestinal:  Negative for abdominal pain.  Genitourinary: Negative.   Musculoskeletal: Negative.  Negative for myalgias.  Skin:  Negative for rash.  Neurological:  Negative for tingling, loss of consciousness and weakness.  Endo/Heme/Allergies:  Negative for polydipsia.     Current Outpatient Medications:    Cyanocobalamin (B-12 PO), Take by mouth., Disp: , Rfl:    metFORMIN (GLUCOPHAGE) 500 MG tablet, Take 1 tablet (500 mg total) by mouth daily with breakfast., Disp: 90 tablet, Rfl: 3   Multiple Vitamins-Minerals (MULTIVITAMIN WITH MINERALS) tablet, Take 1 tablet by mouth daily., Disp: , Rfl:    spironolactone (ALDACTONE) 25 MG tablet, Take 1 tablet (25 mg  total) by mouth daily., Disp: 90 tablet, Rfl: 0   tamsulosin (FLOMAX) 0.4 MG CAPS capsule, Take 1 capsule (0.4 mg total) by mouth daily., Disp: 90 capsule, Rfl: 3   diclofenac Sodium (VOLTAREN) 1 % GEL, Apply 2 grams topically 3 times daily as needed. (Patient not taking: Reported on 07/27/2023), Disp: 100 g, Rfl: 0   Objective:     BP 136/86   Pulse 76   Temp 98 F (36.7 C)   Ht 6\' 1"  (1.854 m)   Wt (!) 375 lb 9.6 oz (170.4 kg)   SpO2 96%   BMI 49.55 kg/m  BP Readings from Last 3 Encounters:  07/27/23 136/86  04/14/22 131/85  06/17/21 126/78   Wt Readings from Last 3 Encounters:  07/27/23 (!) 375 lb 9.6 oz (170.4 kg)  04/14/22 (!) 354 lb 3.2 oz (160.7 kg)  06/17/21 (!) 352 lb (159.7 kg)      Physical Exam Constitutional:      General: He is not in acute distress.    Appearance: Normal appearance. He is obese. He is not ill-appearing, toxic-appearing or diaphoretic.  HENT:     Head: Normocephalic and atraumatic.     Right Ear: Tympanic membrane, ear canal and external ear normal.     Left Ear: Tympanic membrane, ear canal and external ear normal.     Mouth/Throat:     Mouth: Mucous membranes are moist.     Pharynx: Oropharynx is clear. No oropharyngeal  exudate or posterior oropharyngeal erythema.  Eyes:     General: No scleral icterus.       Right eye: No discharge.        Left eye: No discharge.     Extraocular Movements: Extraocular movements intact.     Conjunctiva/sclera: Conjunctivae normal.     Pupils: Pupils are equal, round, and reactive to light.  Cardiovascular:     Rate and Rhythm: Normal rate and regular rhythm.  Pulmonary:     Effort: Pulmonary effort is normal. No respiratory distress.     Breath sounds: Normal breath sounds. No wheezing, rhonchi or rales.  Abdominal:     General: Bowel sounds are normal.     Tenderness: There is no abdominal tenderness. There is no guarding or rebound.  Musculoskeletal:     Cervical back: No rigidity or  tenderness.  Lymphadenopathy:     Cervical: No cervical adenopathy.  Skin:    General: Skin is warm and dry.  Neurological:     Mental Status: He is alert and oriented to person, place, and time.  Psychiatric:        Mood and Affect: Mood normal.        Behavior: Behavior normal.      Results for orders placed or performed in visit on 07/27/23  CBC with Differential/Platelet  Result Value Ref Range   WBC 7.8 4.0 - 10.5 K/uL   RBC 5.37 4.22 - 5.81 Mil/uL   Hemoglobin 15.7 13.0 - 17.0 g/dL   HCT 46.9 62.9 - 52.8 %   MCV 85.2 78.0 - 100.0 fl   MCHC 34.3 30.0 - 36.0 g/dL   RDW 41.3 24.4 - 01.0 %   Platelets 287.0 150.0 - 400.0 K/uL   Neutrophils Relative % 70.2 43.0 - 77.0 %   Lymphocytes Relative 19.6 12.0 - 46.0 %   Monocytes Relative 7.2 3.0 - 12.0 %   Eosinophils Relative 2.4 0.0 - 5.0 %   Basophils Relative 0.6 0.0 - 3.0 %   Neutro Abs 5.5 1.4 - 7.7 K/uL   Lymphs Abs 1.5 0.7 - 4.0 K/uL   Monocytes Absolute 0.6 0.1 - 1.0 K/uL   Eosinophils Absolute 0.2 0.0 - 0.7 K/uL   Basophils Absolute 0.0 0.0 - 0.1 K/uL  Comprehensive metabolic panel  Result Value Ref Range   Sodium 140 135 - 145 mEq/L   Potassium 4.2 3.5 - 5.1 mEq/L   Chloride 105 96 - 112 mEq/L   CO2 25 19 - 32 mEq/L   Glucose, Bld 98 70 - 99 mg/dL   BUN 22 6 - 23 mg/dL   Creatinine, Ser 2.72 0.40 - 1.50 mg/dL   Total Bilirubin 0.7 0.2 - 1.2 mg/dL   Alkaline Phosphatase 127 (H) 39 - 117 U/L   AST 13 0 - 37 U/L   ALT 18 0 - 53 U/L   Total Protein 7.3 6.0 - 8.3 g/dL   Albumin 4.3 3.5 - 5.2 g/dL   GFR 53.66 >44.03 mL/min   Calcium 8.9 8.4 - 10.5 mg/dL  Lipid panel  Result Value Ref Range   Cholesterol 172 0 - 200 mg/dL   Triglycerides 474.2 0.0 - 149.0 mg/dL   HDL 59.56 >38.75 mg/dL   VLDL 64.3 0.0 - 32.9 mg/dL   LDL Cholesterol 518 (H) 0 - 99 mg/dL   Total CHOL/HDL Ratio 4    NonHDL 127.83   Hemoglobin A1c  Result Value Ref Range   Hgb A1c MFr Bld 5.4 4.6 - 6.5 %  Urinalysis, Routine w reflex  microscopic  Result Value Ref Range   Color, Urine YELLOW Yellow;Lt. Yellow;Straw;Dark Yellow;Amber;Green;Red;Brown   APPearance Sl Cloudy (A) Clear;Turbid;Slightly Cloudy;Cloudy   Specific Gravity, Urine 1.015 1.000 - 1.030   pH 6.0 5.0 - 8.0   Total Protein, Urine NEGATIVE Negative   Urine Glucose NEGATIVE Negative   Ketones, ur NEGATIVE Negative   Bilirubin Urine NEGATIVE Negative   Hgb urine dipstick NEGATIVE Negative   Urobilinogen, UA 0.2 0.0 - 1.0   Leukocytes,Ua SMALL (A) Negative   Nitrite POSITIVE (A) Negative   WBC, UA 21-50/hpf (A) 0-2/hpf   RBC / HPF none seen 0-2/hpf   Mucus, UA Presence of (A) None   Squamous Epithelial / HPF Rare(0-4/hpf) Rare(0-4/hpf)   Bacteria, UA Few(10-50/hpf) (A) None   Amorphous Present (A) None;Present  VITAMIN D 25 Hydroxy (Vit-D Deficiency, Fractures)  Result Value Ref Range   VITD 38.94 30.00 - 100.00 ng/mL  Uric acid  Result Value Ref Range   Uric Acid, Serum 9.2 (H) 4.0 - 7.8 mg/dL  PSA  Result Value Ref Range   PSA 1.38 0.10 - 4.00 ng/mL      The 10-year ASCVD risk score (Arnett DK, et al., 2019) is: 13.6%    Assessment & Plan:   Healthcare maintenance  Essential hypertension -     CBC with Differential/Platelet -     Comprehensive metabolic panel -     Urinalysis, Routine w reflex microscopic -     Spironolactone; Take 1 tablet (25 mg total) by mouth daily.  Dispense: 90 tablet; Refill: 0  Screening for cholesterol level -     Comprehensive metabolic panel -     Lipid panel  Benign prostatic hyperplasia with weak urinary stream -     PSA  Morbid obesity with BMI of 45.0-49.9, adult (HCC)  Screening for colon cancer  Prediabetes -     Comprehensive metabolic panel -     Hemoglobin A1c  History of gout -     Comprehensive metabolic panel -     Uric acid  Vitamin D deficiency -     VITAMIN D 25 Hydroxy (Vit-D Deficiency, Fractures)  Abnormal urine -     Urinalysis, Routine w reflex microscopic; Future -      Urine Culture; Future    Return in about 8 weeks (around 09/21/2023).  Information was given on general health maintenance and prevention.  Encouraged regular exercise by walking for 30 minutes most every day.  Encouraged him to find a dentist and have routine dental care.  Discussed referral to bariatric surgery and he said that he would think about it and let me know.  He was given information on Cologuard and advised to have that test done.  He said he would discuss that with his wife.  Continues to decline vaccines but says that he will discuss this issue with his wife.  Information was given on the Shingrix vaccine.  He will also discuss this with his wife.  Will start Aldactone for hypertension.  He had self discontinued the chlorthalidone out of concern of gout.   Mliss Sax, MD   12/26 addendum: Patient is declining recommended blood pressure medicine.  He says that his wife will keep an eye on his blood pressure and they will let me know.  12/30 addendum: Sends in the list of blood pressures taken by his wife I presume in the less than 120/80 range.

## 2023-07-29 NOTE — Addendum Note (Signed)
Addended by: Nadene Rubins A on: 07/29/2023 11:15 AM   Modules accepted: Orders

## 2023-08-02 ENCOUNTER — Other Ambulatory Visit (HOSPITAL_BASED_OUTPATIENT_CLINIC_OR_DEPARTMENT_OTHER): Payer: Self-pay

## 2023-08-02 ENCOUNTER — Encounter: Payer: Self-pay | Admitting: Family Medicine

## 2023-08-02 ENCOUNTER — Other Ambulatory Visit (HOSPITAL_COMMUNITY): Payer: Self-pay

## 2023-08-02 ENCOUNTER — Other Ambulatory Visit: Payer: Self-pay | Admitting: Family Medicine

## 2023-08-02 MED ORDER — TAMSULOSIN HCL 0.4 MG PO CAPS
0.4000 mg | ORAL_CAPSULE | Freq: Every day | ORAL | 3 refills | Status: AC
  Filled 2023-08-02 (×2): qty 90, 90d supply, fill #0
  Filled 2023-10-26: qty 90, 90d supply, fill #1
  Filled 2024-02-14 (×2): qty 90, 90d supply, fill #2
  Filled 2024-03-17 – 2024-05-03 (×3): qty 90, 90d supply, fill #3

## 2023-09-20 ENCOUNTER — Ambulatory Visit: Payer: 59 | Admitting: Family Medicine

## 2023-09-20 ENCOUNTER — Telehealth: Payer: Self-pay

## 2023-09-20 ENCOUNTER — Encounter: Payer: Self-pay | Admitting: Family Medicine

## 2023-09-20 ENCOUNTER — Other Ambulatory Visit (HOSPITAL_COMMUNITY): Payer: Self-pay

## 2023-09-20 VITALS — BP 128/78 | HR 69 | Temp 98.2°F | Resp 16 | Ht 73.0 in | Wt 381.2 lb

## 2023-09-20 DIAGNOSIS — M79641 Pain in right hand: Secondary | ICD-10-CM | POA: Diagnosis not present

## 2023-09-20 MED ORDER — PREDNISONE 10 MG PO TABS
ORAL_TABLET | ORAL | 0 refills | Status: AC
  Filled 2023-09-20 (×2): qty 30, 12d supply, fill #0

## 2023-09-20 NOTE — Assessment & Plan Note (Signed)
 Suspect exacerbation of  chronic issue of gout as the cause of his symptoms.  Less likely could be exacerbation of underlying osteoarthritis.  The hand does not appear infected.  Given lack of injury I do not think an x-ray is needed.  I do not think repeating a uric acid would be beneficial.  We will proceed with prednisone 40 mg daily for 3 days then 30 mg daily for 3 days then 20 mg daily for 3 days then 10 mg daily for 3 days.  Discussed the risk of sleep issues, appetite increase, and agitation with the prednisone.  If he has excessive sleep issues or excessive agitation he will discontinue the prednisone and let us know.  If not improving he will let us know.  Is against using any NSAIDs over-the-counter while on the prednisone.

## 2023-09-20 NOTE — Telephone Encounter (Signed)
 Patient states he would like to transfer his care from Dr. Nadene Rubins to Bethanie Dicker, NP.  Patient states our location in [place] will work better for him.

## 2023-09-20 NOTE — Progress Notes (Signed)
 Marikay Alar, MD Phone: [phone]  Justin Bowen is a 63 y.o. male who presents today for same day visit.   Right hand pain: Patient notes for last 2 weeks his right index finger and hand on that side of his hand has been swollen and painful.  She notes difficulty flexing his right index finger.  He notes no obvious cause.  No recent insect bites.  No injury.  Does have a history of gout and notes this is fairly similar to his gout flares in the past.  He notes no alcohol intake.  No recent seafood intake.  No change to beef intake.  He has tried a over-the-counter supplement with no benefit.  He has tried ibuprofen 400 mg twice daily with little benefit.  In the past has had to take prednisone for similar symptoms.  Social History   Tobacco Use  Smoking Status Never  Smokeless Tobacco Never    Current Outpatient Medications on File Prior to Visit  Medication Sig Dispense Refill   Cyanocobalamin (B-12 PO) Take by mouth.     diclofenac Sodium (VOLTAREN) 1 % GEL Apply 2 grams topically 3 times daily as needed. (Patient not taking: Reported on 07/27/2023) 100 g 0   metFORMIN (GLUCOPHAGE) 500 MG tablet Take 1 tablet (500 mg total) by mouth daily with breakfast. 90 tablet 3   Multiple Vitamins-Minerals (MULTIVITAMIN WITH MINERALS) tablet Take 1 tablet by mouth daily.     tamsulosin (FLOMAX) 0.4 MG CAPS capsule Take 1 capsule (0.4 mg total) by mouth daily. 90 capsule 3   No current facility-administered medications on file prior to visit.     ROS see history of present illness  Objective  Physical Exam Vitals:   09/20/23 0956  BP: 128/78  Pulse: 69  Resp: 16  Temp: 98.2 F (36.8 C)  SpO2: 98%    BP Readings from Last 3 Encounters:  09/20/23 128/78  07/27/23 136/86  04/14/22 131/85   Wt Readings from Last 3 Encounters:  09/20/23 (!) 381 lb 3.2 oz (172.9 kg)  07/27/23 (!) 375 lb 9.6 oz (170.4 kg)  04/14/22 (!) 354 lb 3.2 oz (160.7 kg)    Physical  Exam Musculoskeletal:     Comments: Right hand and right index finger with swelling, mild tenderness noted, no overlying erythema, no excessive warmth, no signs of an insect bite.      Assessment/Plan: Please see individual problem list.  Right hand pain Assessment & Plan: Suspect exacerbation of  chronic issue of gout as the cause of his symptoms.  Less likely could be exacerbation of underlying osteoarthritis.  The hand does not appear infected.  Given lack of injury I do not think an x-ray is needed.  I do not think repeating a uric acid would be beneficial.  We will proceed with prednisone 40 mg daily for 3 days then 30 mg daily for 3 days then 20 mg daily for 3 days then 10 mg daily for 3 days.  Discussed the risk of sleep issues, appetite increase, and agitation with the prednisone.  If he has excessive sleep issues or excessive agitation he will discontinue the prednisone and let us know.  If not improving he will let us know.  Is against using any NSAIDs over-the-counter while on the prednisone.  Orders: -     predniSONE; Take 4 tablets (40 mg total) by mouth daily with breakfast for 3 days, THEN 3 tablets (30 mg total) daily with breakfast for 3 days, THEN 2 tablets (20  mg total) daily with breakfast for 3 days, THEN 1 tablet (10 mg total) daily with breakfast for 3 days.  Dispense: 30 tablet; Refill: 0    Return if symptoms worsen or fail to improve.   Marikay Alar, MD [facility]

## 2023-09-20 NOTE — Patient Instructions (Signed)
 Nice to see you. If your hand is not improving with the prednisone please let us know. If you develop any new or worsening symptoms please be reevaluated.

## 2023-09-21 ENCOUNTER — Telehealth: Payer: Self-pay

## 2023-09-21 NOTE — Telephone Encounter (Signed)
 I left a voicemail for patient letting him know that we have heard back from Bethanie Dicker, NP, and Dr. Nadene Rubins, and both are agreeable to the transfer of his care to Bethanie Dicker, NP.  I asked patient to please call us to schedule his transfer of care visit.  When patient calls back, please schedule his transfer of care appointment with Bethanie Dicker, NP.

## 2023-10-26 ENCOUNTER — Other Ambulatory Visit: Payer: Self-pay

## 2023-10-26 ENCOUNTER — Other Ambulatory Visit (HOSPITAL_COMMUNITY): Payer: Self-pay

## 2023-10-26 ENCOUNTER — Other Ambulatory Visit: Payer: Self-pay | Admitting: Family Medicine

## 2023-10-26 MED ORDER — METFORMIN HCL 500 MG PO TABS
500.0000 mg | ORAL_TABLET | Freq: Every day | ORAL | 3 refills | Status: AC
Start: 2023-10-26 — End: ?
  Filled 2023-10-26: qty 90, 90d supply, fill #0
  Filled 2024-02-14 (×2): qty 90, 90d supply, fill #1

## 2023-12-23 ENCOUNTER — Other Ambulatory Visit (HOSPITAL_COMMUNITY): Payer: Self-pay

## 2023-12-23 ENCOUNTER — Ambulatory Visit: Admitting: Nurse Practitioner

## 2023-12-23 ENCOUNTER — Encounter: Payer: 59 | Admitting: Nurse Practitioner

## 2023-12-23 ENCOUNTER — Encounter: Payer: Self-pay | Admitting: Nurse Practitioner

## 2023-12-23 ENCOUNTER — Other Ambulatory Visit: Payer: Self-pay

## 2023-12-23 VITALS — BP 138/80 | HR 65 | Temp 97.5°F | Ht 73.0 in | Wt 379.6 lb

## 2023-12-23 DIAGNOSIS — Z1329 Encounter for screening for other suspected endocrine disorder: Secondary | ICD-10-CM | POA: Diagnosis not present

## 2023-12-23 DIAGNOSIS — I10 Essential (primary) hypertension: Secondary | ICD-10-CM

## 2023-12-23 DIAGNOSIS — N401 Enlarged prostate with lower urinary tract symptoms: Secondary | ICD-10-CM

## 2023-12-23 DIAGNOSIS — R7303 Prediabetes: Secondary | ICD-10-CM

## 2023-12-23 DIAGNOSIS — K219 Gastro-esophageal reflux disease without esophagitis: Secondary | ICD-10-CM | POA: Diagnosis not present

## 2023-12-23 DIAGNOSIS — R3912 Poor urinary stream: Secondary | ICD-10-CM

## 2023-12-23 DIAGNOSIS — Z13220 Encounter for screening for lipoid disorders: Secondary | ICD-10-CM

## 2023-12-23 LAB — LIPID PANEL
Cholesterol: 165 mg/dL (ref 0–200)
HDL: 35.4 mg/dL — ABNORMAL LOW (ref >39.00)
LDL Cholesterol: 106 mg/dL — ABNORMAL HIGH (ref 0–99)
NonHDL: 129.74
Total CHOL/HDL Ratio: 5
Triglycerides: 121 mg/dL (ref 0.0–149.0)
VLDL: 24.2 mg/dL (ref 0.0–40.0)

## 2023-12-23 LAB — COMPREHENSIVE METABOLIC PANEL WITH GFR
ALT: 15 U/L (ref 0–53)
AST: 11 U/L (ref 0–37)
Albumin: 4.1 g/dL (ref 3.5–5.2)
Alkaline Phosphatase: 104 U/L (ref 39–117)
BUN: 19 mg/dL (ref 6–23)
CO2: 28 meq/L (ref 19–32)
Calcium: 8.8 mg/dL (ref 8.4–10.5)
Chloride: 106 meq/L (ref 96–112)
Creatinine, Ser: 1.14 mg/dL (ref 0.40–1.50)
GFR: 68.38 mL/min (ref >60.00)
Glucose, Bld: 93 mg/dL (ref 70–99)
Potassium: 4.7 meq/L (ref 3.5–5.1)
Sodium: 140 meq/L (ref 135–145)
Total Bilirubin: 0.5 mg/dL (ref 0.2–1.2)
Total Protein: 7.1 g/dL (ref 6.0–8.3)

## 2023-12-23 LAB — TSH: TSH: 2.57 u[IU]/mL (ref 0.35–5.50)

## 2023-12-23 LAB — HEMOGLOBIN A1C: Hgb A1c MFr Bld: 5.3 % (ref 4.6–6.5)

## 2023-12-23 MED ORDER — OMEPRAZOLE 20 MG PO CPDR
20.0000 mg | DELAYED_RELEASE_CAPSULE | Freq: Every day | ORAL | 3 refills | Status: AC
  Filled 2023-12-23: qty 90, 90d supply, fill #0
  Filled 2024-03-17: qty 90, 90d supply, fill #1
  Filled 2024-07-08: qty 90, 90d supply, fill #2

## 2023-12-23 NOTE — Assessment & Plan Note (Addendum)
 Home blood pressure readings are normal, office reading today elevated with improvement on second reading. He is not on antihypertensive medication and reports no symptoms. Provide a blood pressure log for home monitoring. Instruct him to take blood pressure twice daily, morning and evening, after resting, and send the log via MyChart in 1-2 weeks for review. Check CMP today. Encourage weight loss and decreased sodium intake.

## 2023-12-23 NOTE — Progress Notes (Signed)
 Bluford Burkitt, NP-C Phone: [phone]  Justin Bowen is a 63 y.o. male who presents today for transfer of care.   Discussed the use of AI scribe software for clinical note transcription with the patient, who gave verbal consent to proceed.  History of Present Illness   Justin Bowen is a 63 year old male with hypertension and gout who presents for transfer of care visit.  He has concerns about his blood pressure management. He was previously placed on blood pressure medication approximately three to four years ago but discontinued it due to side effects such as dizziness, which he found concerning given his occupation in [employer]. He monitors his blood pressure at home, reporting readings averaging in the 120s/70s. He has heard of 'white coat syndrome' and discussed it as a possible explanation for higher readings in the clinic.  He experiences gout flares approximately every two years, with the last significant episode occurring before his previous doctor transferred to [facility]. He was advised to stop taking blood pressure medication during a gout flare, which he did, and he reports feeling better after discontinuation.  He is currently on metformin  for prediabetes but does not engage in regular physical activity. His diet is described as low in starches, focusing on vegetables and meat, though he occasionally consumes diet soda.  He experiences occasional acid reflux, particularly at night, which sometimes prompts him to eat to alleviate discomfort. He used to take medication for acid reflux but stopped due to concerns about long-term effects.  No chest pain, shortness of breath, or significant dizziness. He notes minor swelling in his legs but does not consider it significant. He is currently taking Flomax  and does not see a urologist.  He expresses reluctance to undergo certain preventative screenings, such as colonoscopy. He is open to PSA testing for prostate health, which he believes  was last checked in December.      Social History   Tobacco Use  Smoking Status Never  Smokeless Tobacco Never    Current Outpatient Medications on File Prior to Visit  Medication Sig Dispense Refill   cholecalciferol (VITAMIN D3) 25 MCG (1000 UNIT) tablet Take 1,000 Units by mouth daily.     Cyanocobalamin (B-12 PO) Take by mouth.     metFORMIN  (GLUCOPHAGE ) 500 MG tablet Take 1 tablet (500 mg total) by mouth daily with breakfast. 90 tablet 3   Multiple Vitamins-Minerals (MULTIVITAMIN WITH MINERALS) tablet Take 1 tablet by mouth daily.     tamsulosin  (FLOMAX ) 0.4 MG CAPS capsule Take 1 capsule (0.4 mg total) by mouth daily. 90 capsule 3   No current facility-administered medications on file prior to visit.    ROS see history of present illness  Objective  Physical Exam Vitals:   12/23/23 0958 12/23/23 1032  BP: (!) 150/84 138/80  Pulse: 65   Temp: (!) 97.5 F (36.4 C)   SpO2: 97%     BP Readings from Last 3 Encounters:  12/23/23 138/80  09/20/23 128/78  07/27/23 136/86   Wt Readings from Last 3 Encounters:  12/23/23 (!) 379 lb 9.6 oz (172.2 kg)  09/20/23 (!) 381 lb 3.2 oz (172.9 kg)  07/27/23 (!) 375 lb 9.6 oz (170.4 kg)    Physical Exam Constitutional:      General: He is not in acute distress.    Appearance: Normal appearance. He is obese.  HENT:     Head: Normocephalic.  Cardiovascular:     Rate and Rhythm: Normal rate and regular rhythm.  Heart sounds: Normal heart sounds.  Pulmonary:     Effort: Pulmonary effort is normal.     Breath sounds: Normal breath sounds.  Skin:    General: Skin is warm and dry.  Neurological:     General: No focal deficit present.     Mental Status: He is alert.  Psychiatric:        Mood and Affect: Mood normal.        Behavior: Behavior normal.      Assessment/Plan: Please see individual problem list.  Essential hypertension Assessment & Plan: Home blood pressure readings are normal, office reading today  elevated with improvement on second reading. He is not on antihypertensive medication and reports no symptoms. Provide a blood pressure log for home monitoring. Instruct him to take blood pressure twice daily, morning and evening, after resting, and send the log via MyChart in 1-2 weeks for review. Check CMP today. Encourage weight loss and decreased sodium intake.   Orders: -     Comprehensive metabolic panel with GFR  Gastroesophageal reflux disease, unspecified whether esophagitis present Assessment & Plan: He experiences occasional nocturnal discomfort and previously discontinued medication. Discussed the benefits of medication to manage symptoms, and he agreed to try omeprazole. Start omeprazole 20 mg daily for acid reflux. Advise avoiding eating a few hours before bedtime. Instruct him to compare the cost of the omeprazole prescription with his current OTC purchase. Counseled on common dietary triggers.   Orders: -     Omeprazole; Take 1 capsule (20 mg total) by mouth daily.  Dispense: 90 capsule; Refill: 3  Prediabetes Assessment & Plan: He is on metformin  and follows a low starch diet but does not exercise regularly. Emphasize weight loss and exercise to prevent diabetes progression. Encourage a high protein, low carbohydrate diet and advise on a caloric deficit for weight loss. Advise avoiding eating during middle of the night. Recommend regular physical activity to prevent progression to diabetes. Check A1c today.   Orders: -     Hemoglobin A1c  Benign prostatic hyperplasia with weak urinary stream Assessment & Plan: Well managed with Flomax  0.4 mg daily. Continue.   Lipid screening -     Lipid panel  Thyroid  disorder screen -     TSH     Return in about 3 months (around 03/24/2024) for Follow up.   Bluford Burkitt, NP-C [facility]

## 2023-12-23 NOTE — Assessment & Plan Note (Addendum)
 He experiences occasional nocturnal discomfort and previously discontinued medication. Discussed the benefits of medication to manage symptoms, and he agreed to try omeprazole. Start omeprazole 20 mg daily for acid reflux. Advise avoiding eating a few hours before bedtime. Instruct him to compare the cost of the omeprazole prescription with his current OTC purchase. Counseled on common dietary triggers.

## 2023-12-23 NOTE — Patient Instructions (Signed)
 It was nice to meet you.   Please continue checking your blood pressure daily at home and send me your readings to review in 1-2 weeks.  I have sent in Omeprazole 20 mg daily for you to start taking to help with your acid reflux. I have also attached some dietary information and tips for acid reflux. If your symptoms continue, you can try increasing to twice daily.

## 2023-12-23 NOTE — Assessment & Plan Note (Signed)
 Well managed with Flomax  0.4 mg daily. Continue.

## 2023-12-23 NOTE — Assessment & Plan Note (Addendum)
 He is on metformin  and follows a low starch diet but does not exercise regularly. Emphasize weight loss and exercise to prevent diabetes progression. Encourage a high protein, low carbohydrate diet and advise on a caloric deficit for weight loss. Advise avoiding eating during middle of the night. Recommend regular physical activity to prevent progression to diabetes. Check A1c today.

## 2023-12-24 ENCOUNTER — Other Ambulatory Visit (HOSPITAL_COMMUNITY): Payer: Self-pay

## 2023-12-24 ENCOUNTER — Ambulatory Visit: Payer: Self-pay | Admitting: Nurse Practitioner

## 2023-12-25 ENCOUNTER — Other Ambulatory Visit (HOSPITAL_COMMUNITY): Payer: Self-pay

## 2023-12-28 ENCOUNTER — Telehealth: Payer: Self-pay

## 2023-12-28 ENCOUNTER — Other Ambulatory Visit (HOSPITAL_COMMUNITY): Payer: Self-pay

## 2023-12-28 ENCOUNTER — Other Ambulatory Visit: Payer: Self-pay | Admitting: Nurse Practitioner

## 2023-12-28 DIAGNOSIS — E785 Hyperlipidemia, unspecified: Secondary | ICD-10-CM

## 2023-12-28 MED ORDER — ROSUVASTATIN CALCIUM 10 MG PO TABS
10.0000 mg | ORAL_TABLET | Freq: Every day | ORAL | 3 refills | Status: AC
  Filled 2023-12-28: qty 90, 90d supply, fill #0
  Filled 2024-03-17: qty 90, 90d supply, fill #1

## 2023-12-28 NOTE — Telephone Encounter (Signed)
 Provider would like pt scheduled a 6 week LAB appt orders are already in

## 2024-02-10 ENCOUNTER — Other Ambulatory Visit

## 2024-02-14 ENCOUNTER — Other Ambulatory Visit (HOSPITAL_COMMUNITY): Payer: Self-pay

## 2024-02-15 ENCOUNTER — Other Ambulatory Visit (HOSPITAL_COMMUNITY): Payer: Self-pay

## 2024-02-16 ENCOUNTER — Ambulatory Visit: Payer: Self-pay | Admitting: Nurse Practitioner

## 2024-02-16 ENCOUNTER — Other Ambulatory Visit (INDEPENDENT_AMBULATORY_CARE_PROVIDER_SITE_OTHER)

## 2024-02-16 DIAGNOSIS — E785 Hyperlipidemia, unspecified: Secondary | ICD-10-CM

## 2024-02-16 LAB — HEPATIC FUNCTION PANEL
ALT: 13 U/L (ref 0–53)
AST: 11 U/L (ref 0–37)
Albumin: 4.2 g/dL (ref 3.5–5.2)
Alkaline Phosphatase: 107 U/L (ref 39–117)
Bilirubin, Direct: 0.2 mg/dL (ref 0.0–0.3)
Total Bilirubin: 0.6 mg/dL (ref 0.2–1.2)
Total Protein: 7.7 g/dL (ref 6.0–8.3)

## 2024-02-16 LAB — LIPID PANEL
Cholesterol: 168 mg/dL (ref 0–200)
HDL: 39.2 mg/dL (ref >39.00)
LDL Cholesterol: 102 mg/dL — ABNORMAL HIGH (ref 0–99)
NonHDL: 128.42
Total CHOL/HDL Ratio: 4
Triglycerides: 132 mg/dL (ref 0.0–149.0)
VLDL: 26.4 mg/dL (ref 0.0–40.0)

## 2024-03-17 ENCOUNTER — Other Ambulatory Visit (HOSPITAL_COMMUNITY): Payer: Self-pay

## 2024-03-17 ENCOUNTER — Other Ambulatory Visit: Payer: Self-pay

## 2024-03-22 ENCOUNTER — Other Ambulatory Visit: Payer: Self-pay

## 2024-03-29 ENCOUNTER — Ambulatory Visit: Admitting: Nurse Practitioner

## 2024-04-11 ENCOUNTER — Ambulatory Visit: Admitting: Nurse Practitioner

## 2024-05-03 ENCOUNTER — Other Ambulatory Visit (HOSPITAL_COMMUNITY): Payer: Self-pay

## 2024-05-11 ENCOUNTER — Ambulatory Visit: Admitting: Nurse Practitioner

## 2024-07-08 ENCOUNTER — Other Ambulatory Visit: Payer: Self-pay | Admitting: Family Medicine

## 2024-07-08 ENCOUNTER — Other Ambulatory Visit (HOSPITAL_COMMUNITY): Payer: Self-pay

## 2024-07-10 ENCOUNTER — Other Ambulatory Visit: Payer: Self-pay

## 2024-07-11 NOTE — Telephone Encounter (Signed)
 Refill request received for Tamsulosin  0.4mg  QNC:Wnwz LOV:07/26/2024 Last refill:08/02/2023 Medication is denied. Pt needs OV. Also looks as though pt may have changed PCPs

## 2024-07-20 ENCOUNTER — Ambulatory Visit: Admitting: Nurse Practitioner

## 2024-08-25 ENCOUNTER — Other Ambulatory Visit: Payer: Self-pay | Admitting: Family Medicine

## 2024-09-04 ENCOUNTER — Encounter: Payer: Self-pay | Admitting: Nurse Practitioner

## 2024-09-05 ENCOUNTER — Telehealth: Payer: Self-pay | Admitting: *Deleted

## 2024-09-05 NOTE — Telephone Encounter (Signed)
 Copied from CRM [id number]. Topic: Clinical - Request for Lab/Test Order >> Sep 04, 2024 10:57 AM Viola F wrote: Reason for CRM: Patient would like lab orders put in for physical that is scheduled 10/18/24. Since he lives far away he would like the labs done prior at a location close to him. He will be sending MyChart message with the information.

## 2024-09-06 ENCOUNTER — Other Ambulatory Visit: Payer: Self-pay | Admitting: Nurse Practitioner

## 2024-09-06 DIAGNOSIS — Z13220 Encounter for screening for lipoid disorders: Secondary | ICD-10-CM

## 2024-09-06 DIAGNOSIS — E559 Vitamin D deficiency, unspecified: Secondary | ICD-10-CM

## 2024-09-06 DIAGNOSIS — Z1329 Encounter for screening for other suspected endocrine disorder: Secondary | ICD-10-CM

## 2024-09-06 DIAGNOSIS — Z125 Encounter for screening for malignant neoplasm of prostate: Secondary | ICD-10-CM

## 2024-09-06 DIAGNOSIS — N401 Enlarged prostate with lower urinary tract symptoms: Secondary | ICD-10-CM

## 2024-09-06 DIAGNOSIS — I10 Essential (primary) hypertension: Secondary | ICD-10-CM

## 2024-09-06 DIAGNOSIS — R7303 Prediabetes: Secondary | ICD-10-CM

## 2024-10-18 ENCOUNTER — Encounter: Payer: Self-pay | Admitting: Nurse Practitioner
